# Patient Record
Sex: Male | Born: 1965 | Race: Black or African American | Hispanic: No | Smoking: Never smoker
Health system: Southern US, Community
[De-identification: ages and names within clinical notes are randomized; demographics above are authoritative.]

## PROBLEM LIST (undated history)

## (undated) HISTORY — PX: OTHER SURGICAL HISTORY: SHX169

## (undated) HISTORY — PX: NECK SURGERY: SHX720

---

## 1998-08-17 ENCOUNTER — Emergency Department (HOSPITAL_COMMUNITY): Admission: EM | Admit: 1998-08-17 | Discharge: 1998-08-17 | Payer: Self-pay | Admitting: Emergency Medicine

## 2001-07-01 ENCOUNTER — Emergency Department (HOSPITAL_COMMUNITY): Admission: EM | Admit: 2001-07-01 | Discharge: 2001-07-01 | Payer: Self-pay | Admitting: *Deleted

## 2002-02-06 ENCOUNTER — Emergency Department (HOSPITAL_COMMUNITY): Admission: EM | Admit: 2002-02-06 | Discharge: 2002-02-06 | Payer: Self-pay | Admitting: Emergency Medicine

## 2002-02-06 ENCOUNTER — Encounter: Payer: Self-pay | Admitting: Emergency Medicine

## 2002-03-02 ENCOUNTER — Encounter: Payer: Self-pay | Admitting: *Deleted

## 2002-03-02 ENCOUNTER — Emergency Department (HOSPITAL_COMMUNITY): Admission: EM | Admit: 2002-03-02 | Discharge: 2002-03-02 | Payer: Self-pay | Admitting: *Deleted

## 2002-03-11 ENCOUNTER — Ambulatory Visit (HOSPITAL_COMMUNITY): Admission: RE | Admit: 2002-03-11 | Discharge: 2002-03-11 | Payer: Self-pay | Admitting: Family Medicine

## 2002-03-11 ENCOUNTER — Encounter: Payer: Self-pay | Admitting: Family Medicine

## 2002-04-15 ENCOUNTER — Observation Stay (HOSPITAL_COMMUNITY): Admission: RE | Admit: 2002-04-15 | Discharge: 2002-04-16 | Payer: Self-pay | Admitting: Neurosurgery

## 2002-07-25 ENCOUNTER — Encounter (HOSPITAL_COMMUNITY): Admission: RE | Admit: 2002-07-25 | Discharge: 2002-08-24 | Payer: Self-pay | Admitting: General Surgery

## 2002-12-09 ENCOUNTER — Ambulatory Visit (HOSPITAL_COMMUNITY): Admission: RE | Admit: 2002-12-09 | Discharge: 2002-12-09 | Payer: Self-pay | Admitting: Neurosurgery

## 2003-10-09 ENCOUNTER — Ambulatory Visit (HOSPITAL_COMMUNITY): Admission: RE | Admit: 2003-10-09 | Discharge: 2003-10-09 | Payer: Self-pay | Admitting: Family Medicine

## 2003-10-28 ENCOUNTER — Ambulatory Visit (HOSPITAL_COMMUNITY): Admission: RE | Admit: 2003-10-28 | Discharge: 2003-10-28 | Payer: Self-pay | Admitting: General Surgery

## 2004-02-10 ENCOUNTER — Emergency Department (HOSPITAL_COMMUNITY): Admission: EM | Admit: 2004-02-10 | Discharge: 2004-02-10 | Payer: Self-pay | Admitting: Emergency Medicine

## 2004-04-15 ENCOUNTER — Inpatient Hospital Stay (HOSPITAL_COMMUNITY): Admission: RE | Admit: 2004-04-15 | Discharge: 2004-04-17 | Payer: Self-pay | Admitting: Neurosurgery

## 2004-07-05 ENCOUNTER — Ambulatory Visit (HOSPITAL_COMMUNITY): Admission: RE | Admit: 2004-07-05 | Discharge: 2004-07-05 | Payer: Self-pay | Admitting: Neurosurgery

## 2004-07-06 ENCOUNTER — Encounter (HOSPITAL_COMMUNITY): Admission: RE | Admit: 2004-07-06 | Discharge: 2004-08-05 | Payer: Self-pay | Admitting: Neurosurgery

## 2004-08-10 ENCOUNTER — Ambulatory Visit (HOSPITAL_COMMUNITY): Admission: RE | Admit: 2004-08-10 | Discharge: 2004-08-10 | Payer: Self-pay | Admitting: General Surgery

## 2006-07-07 ENCOUNTER — Emergency Department (HOSPITAL_COMMUNITY): Admission: EM | Admit: 2006-07-07 | Discharge: 2006-07-07 | Payer: Self-pay | Admitting: Emergency Medicine

## 2006-08-03 ENCOUNTER — Ambulatory Visit (HOSPITAL_COMMUNITY): Admission: RE | Admit: 2006-08-03 | Discharge: 2006-08-03 | Payer: Self-pay | Admitting: General Surgery

## 2006-12-01 ENCOUNTER — Emergency Department (HOSPITAL_COMMUNITY): Admission: EM | Admit: 2006-12-01 | Discharge: 2006-12-01 | Payer: Self-pay | Admitting: Emergency Medicine

## 2006-12-26 ENCOUNTER — Emergency Department (HOSPITAL_COMMUNITY): Admission: EM | Admit: 2006-12-26 | Discharge: 2006-12-26 | Payer: Self-pay | Admitting: Emergency Medicine

## 2007-02-26 ENCOUNTER — Encounter
Admission: RE | Admit: 2007-02-26 | Discharge: 2007-05-27 | Payer: Self-pay | Admitting: Physical Medicine and Rehabilitation

## 2007-02-26 ENCOUNTER — Ambulatory Visit: Payer: Self-pay | Admitting: Physical Medicine and Rehabilitation

## 2007-04-18 ENCOUNTER — Ambulatory Visit: Payer: Self-pay | Admitting: Physical Medicine and Rehabilitation

## 2007-05-21 ENCOUNTER — Ambulatory Visit: Payer: Self-pay | Admitting: Physical Medicine and Rehabilitation

## 2007-06-20 ENCOUNTER — Encounter
Admission: RE | Admit: 2007-06-20 | Discharge: 2007-09-18 | Payer: Self-pay | Admitting: Physical Medicine and Rehabilitation

## 2007-06-20 ENCOUNTER — Ambulatory Visit: Payer: Self-pay | Admitting: Physical Medicine and Rehabilitation

## 2007-08-14 ENCOUNTER — Ambulatory Visit: Payer: Self-pay | Admitting: Physical Medicine and Rehabilitation

## 2007-12-04 ENCOUNTER — Encounter
Admission: RE | Admit: 2007-12-04 | Discharge: 2007-12-05 | Payer: Self-pay | Admitting: Physical Medicine and Rehabilitation

## 2007-12-04 ENCOUNTER — Ambulatory Visit: Payer: Self-pay | Admitting: Physical Medicine and Rehabilitation

## 2007-12-31 ENCOUNTER — Emergency Department (HOSPITAL_COMMUNITY): Admission: EM | Admit: 2007-12-31 | Discharge: 2007-12-31 | Payer: Self-pay | Admitting: Emergency Medicine

## 2008-03-24 ENCOUNTER — Encounter
Admission: RE | Admit: 2008-03-24 | Discharge: 2008-06-22 | Payer: Self-pay | Admitting: Physical Medicine and Rehabilitation

## 2008-03-26 ENCOUNTER — Ambulatory Visit: Payer: Self-pay | Admitting: Physical Medicine and Rehabilitation

## 2008-03-27 ENCOUNTER — Ambulatory Visit (HOSPITAL_COMMUNITY)
Admission: RE | Admit: 2008-03-27 | Discharge: 2008-03-27 | Payer: Self-pay | Admitting: Physical Medicine and Rehabilitation

## 2008-04-01 ENCOUNTER — Encounter (HOSPITAL_COMMUNITY)
Admission: RE | Admit: 2008-04-01 | Discharge: 2008-05-01 | Payer: Self-pay | Admitting: Physical Medicine and Rehabilitation

## 2008-05-06 ENCOUNTER — Encounter (HOSPITAL_COMMUNITY)
Admission: RE | Admit: 2008-05-06 | Discharge: 2008-06-05 | Payer: Self-pay | Admitting: Physical Medicine and Rehabilitation

## 2008-05-14 ENCOUNTER — Ambulatory Visit: Payer: Self-pay | Admitting: Physical Medicine and Rehabilitation

## 2008-06-05 ENCOUNTER — Encounter (HOSPITAL_COMMUNITY)
Admission: RE | Admit: 2008-06-05 | Discharge: 2008-07-05 | Payer: Self-pay | Admitting: Physical Medicine and Rehabilitation

## 2008-06-12 ENCOUNTER — Encounter
Admission: RE | Admit: 2008-06-12 | Discharge: 2008-08-08 | Payer: Self-pay | Admitting: Physical Medicine and Rehabilitation

## 2008-06-13 ENCOUNTER — Ambulatory Visit: Payer: Self-pay | Admitting: Physical Medicine and Rehabilitation

## 2008-07-11 ENCOUNTER — Ambulatory Visit: Payer: Self-pay | Admitting: Physical Medicine and Rehabilitation

## 2008-08-08 ENCOUNTER — Ambulatory Visit: Payer: Self-pay | Admitting: Physical Medicine and Rehabilitation

## 2008-09-01 ENCOUNTER — Encounter
Admission: RE | Admit: 2008-09-01 | Discharge: 2008-09-05 | Payer: Self-pay | Admitting: Physical Medicine and Rehabilitation

## 2008-09-05 ENCOUNTER — Ambulatory Visit: Payer: Self-pay | Admitting: Physical Medicine and Rehabilitation

## 2008-12-04 ENCOUNTER — Encounter
Admission: RE | Admit: 2008-12-04 | Discharge: 2008-12-04 | Payer: Self-pay | Admitting: Physical Medicine and Rehabilitation

## 2009-07-03 ENCOUNTER — Emergency Department (HOSPITAL_COMMUNITY): Admission: EM | Admit: 2009-07-03 | Discharge: 2009-07-03 | Payer: Self-pay | Admitting: Emergency Medicine

## 2009-08-20 ENCOUNTER — Encounter
Admission: RE | Admit: 2009-08-20 | Discharge: 2009-11-18 | Payer: Self-pay | Admitting: Physical Medicine and Rehabilitation

## 2009-08-24 ENCOUNTER — Ambulatory Visit: Payer: Self-pay | Admitting: Physical Medicine and Rehabilitation

## 2009-08-26 ENCOUNTER — Ambulatory Visit (HOSPITAL_COMMUNITY)
Admission: RE | Admit: 2009-08-26 | Discharge: 2009-08-26 | Payer: Self-pay | Admitting: Physical Medicine and Rehabilitation

## 2009-08-26 ENCOUNTER — Emergency Department (HOSPITAL_COMMUNITY): Admission: EM | Admit: 2009-08-26 | Discharge: 2009-08-26 | Payer: Self-pay | Admitting: Emergency Medicine

## 2009-09-28 ENCOUNTER — Ambulatory Visit: Payer: Self-pay | Admitting: Physical Medicine and Rehabilitation

## 2009-09-30 ENCOUNTER — Encounter (HOSPITAL_COMMUNITY)
Admission: RE | Admit: 2009-09-30 | Discharge: 2009-10-30 | Payer: Self-pay | Admitting: Physical Medicine and Rehabilitation

## 2009-11-09 ENCOUNTER — Ambulatory Visit: Payer: Self-pay | Admitting: Physical Medicine and Rehabilitation

## 2009-11-17 ENCOUNTER — Ambulatory Visit (HOSPITAL_COMMUNITY)
Admission: RE | Admit: 2009-11-17 | Discharge: 2009-11-17 | Payer: Self-pay | Admitting: Physical Medicine and Rehabilitation

## 2009-11-26 ENCOUNTER — Emergency Department (HOSPITAL_COMMUNITY): Admission: EM | Admit: 2009-11-26 | Discharge: 2009-11-26 | Payer: Self-pay | Admitting: Emergency Medicine

## 2009-12-10 ENCOUNTER — Encounter
Admission: RE | Admit: 2009-12-10 | Discharge: 2010-03-10 | Payer: Self-pay | Admitting: Physical Medicine and Rehabilitation

## 2009-12-11 ENCOUNTER — Ambulatory Visit: Payer: Self-pay | Admitting: Physical Medicine and Rehabilitation

## 2009-12-11 ENCOUNTER — Emergency Department (HOSPITAL_COMMUNITY): Admission: EM | Admit: 2009-12-11 | Discharge: 2009-12-11 | Payer: Self-pay | Admitting: Emergency Medicine

## 2010-05-03 ENCOUNTER — Ambulatory Visit (HOSPITAL_COMMUNITY): Admission: RE | Admit: 2010-05-03 | Discharge: 2010-05-03 | Payer: Self-pay | Admitting: Neurosurgery

## 2010-05-06 ENCOUNTER — Emergency Department (HOSPITAL_COMMUNITY): Admission: EM | Admit: 2010-05-06 | Discharge: 2010-05-06 | Payer: Self-pay | Admitting: Emergency Medicine

## 2010-12-18 ENCOUNTER — Encounter: Payer: Self-pay | Admitting: Internal Medicine

## 2010-12-19 ENCOUNTER — Encounter: Payer: Self-pay | Admitting: Physical Medicine and Rehabilitation

## 2011-02-13 LAB — CBC
HCT: 42.9 % (ref 39.0–52.0)
Hemoglobin: 14.6 g/dL (ref 13.0–17.0)
Platelets: 266 10*3/uL (ref 150–400)
WBC: 5 10*3/uL (ref 4.0–10.5)

## 2011-02-13 LAB — BASIC METABOLIC PANEL
Calcium: 9.6 mg/dL (ref 8.4–10.5)
Creatinine, Ser: 1.11 mg/dL (ref 0.4–1.5)
GFR calc Af Amer: >60 mL/min (ref >60)
GFR calc non Af Amer: >60 mL/min (ref >60)

## 2011-02-13 LAB — DIFFERENTIAL
Basophils Relative: 1 % (ref 0–1)
Eosinophils Absolute: 0.1 10*3/uL (ref 0.0–0.7)
Lymphocytes Relative: 40 % (ref 12–46)
Lymphs Abs: 2 10*3/uL (ref 0.7–4.0)
Monocytes Relative: 10 % (ref 3–12)
Neutro Abs: 2.4 10*3/uL (ref 1.7–7.7)

## 2011-02-13 LAB — ETHANOL: Alcohol, Ethyl (B): <5 mg/dL (ref 0–10)

## 2011-02-13 LAB — RAPID URINE DRUG SCREEN, HOSP PERFORMED
Amphetamines: NOT DETECTED
Benzodiazepines: NOT DETECTED

## 2011-02-14 LAB — DIFFERENTIAL
Eosinophils Absolute: 0.1 10*3/uL (ref 0.0–0.7)
Lymphocytes Relative: 43 % (ref 12–46)
Lymphs Abs: 2.2 10*3/uL (ref 0.7–4.0)
Neutrophils Relative %: 47 % (ref 43–77)

## 2011-02-14 LAB — BASIC METABOLIC PANEL
CO2: 32 mEq/L (ref 19–32)
Calcium: 9.4 mg/dL (ref 8.4–10.5)
Chloride: 102 mEq/L (ref 96–112)
Potassium: 3.9 mEq/L (ref 3.5–5.1)
Sodium: 138 mEq/L (ref 135–145)

## 2011-02-14 LAB — CBC
HCT: 40.2 % (ref 39.0–52.0)
Hemoglobin: 13.6 g/dL (ref 13.0–17.0)
MCHC: 33.8 g/dL (ref 30.0–36.0)
MCV: 88 fL (ref 78.0–100.0)
Platelets: 244 10*3/uL (ref 150–400)
RDW: 15 % (ref 11.5–15.5)

## 2011-02-14 LAB — URINALYSIS, ROUTINE W REFLEX MICROSCOPIC
Bilirubin Urine: NEGATIVE
Glucose, UA: NEGATIVE mg/dL
Ketones, ur: NEGATIVE mg/dL
Protein, ur: NEGATIVE mg/dL

## 2011-02-14 LAB — RAPID URINE DRUG SCREEN, HOSP PERFORMED
Amphetamines: NOT DETECTED
Opiates: NOT DETECTED
Tetrahydrocannabinol: POSITIVE — AB

## 2011-04-12 NOTE — Assessment & Plan Note (Signed)
Craig Orr is a 45 year old, African-American gentleman, who was  seen last by me in our [facility] on May 24, 2007.   He is back in today for refill of his medication.   He has multiple chronic pain complaints predominantly revolving around  his cervical spine.  He has had multiple fusions at C3-4, 4-5, 5-6, and  add-on C6-7 by Dr. Lovell Sheehan.   His average pain is about an 8 on a scale of 10.  The pain is described  as constant, aching, burning, stabbing, worse with bending, and certain  activities aggravate him more, improves with medications, and he is  getting fair relief with the current meds that he is on from this  clinic.   MEDICATIONS PROVIDED BY THIS CLINIC:  1. Neurontin, which he takes 300 mg three times a day.  2. Lidoderm 5% patch, 12 hours on and 12 hours off.   FUNCTIONAL STATUS:  The patient is able to walk about 30 minutes at a  time, he is able to drive, he is independent with his self-care with the  exception of bathing, he needs some help washing his back.  Denies  problems controlling bowel or bladder.  Denies depression, anxiety, or  suicidal ideation.  Does have some intermittent tingling and spasms.   Health and History Form Review of Systems is attached to this chart.   No other change in his past medical, social, or family history since  last visit.   PHYSICAL EXAMINATION:  VITAL SIGNS:  Blood pressure is 120/67, pulse 48,  respirations 18, 99% saturated on room air.  GENERAL:  He is a thin, well-developed gentleman, who does not appear in  any distress.  He does appear his stated age.   He is oriented x3.  Speech is clear.  Affect is bright, alert,  cooperative, and pleasant.  He follows commands without difficulty.   Transitioning from sit to stand easily.  Gait in the room is  nonantalgic.  Tandem gait and Romberg's test are performed adequately.  He has significantly decreased range of motion in all planes in his  cervical spine.  Shoulder range is also somewhat diminished especially  with abduction passed 90.  Forward flexion is approximately 110  bilaterally.   Motor strength is good in both upper extremities without focal weakness.  He is a well-muscled gentleman.  He does complain of some discomfort  with testing intrinsic muscles and finger flexors, however.   Reflexes are symmetric and intact, no abnormal tone is noted, no clonus  is noted, motor strength is good throughout both upper and lower  extremities.   IMPRESSION:  1. Cervicalgia, status post C3-4, C4-5, C5-6, add-on C6-7 fusion by      Dr. Lovell Sheehan.  2. Stable cervical myelomalacia.  3. Cervicalgia.  4. Sensory deficits in the left upper extremity.  5. History of upper and lower extremity spasms currently not a problem      today.  At the last visit, he had had some problems with flank pain      and apparently this has not been a problem for him at this visit.   We will refill the following medications for him:  Neurontin 300 mg one  p.o., will increase this to 5 times per day with one refill.  We will  start him on some ibuprofen 600 mg in the morning with one refill, 30  tablets, and Prilosec 20 mg one p.o. daily, #30, one refill.  We will  see him back in two months.  He has not had any problems with over-  sedation or balance problems from any of the medication.           ______________________________  Brantley Stage, M.D.     DMK/MedQ  D:  06/21/2007 11:40:38  T:  06/21/2007 19:13:53  Job #:  [id number]

## 2011-04-12 NOTE — Assessment & Plan Note (Signed)
Craig Orr is a 45 year old African-American gentleman who was  last seen by me on August 16, 2007.  He has a history of a cervical  fusion at multiple levels, C3, basically to C7 by Dr. Lovell Orr.  He is  here for refill of his medications.  He continues to have some pain  especially around the parascapular region in the posterior cervical  region, down the left arm, intermittently.  Low back pain has been a  milder problem for him as well.  His average pain is between a 6 and a 7  on a scale of 10.  It is worse with activities, walking and bending,  improves with medication.  He gets fair relief with the current  medications that he is on.  The pain affects his general activity  moderately, and affects his enjoyment of life a little bit.   Medications provided by our clinic include Neurontin 400 mg one p.o.  q.i.d. #120, and ibuprofen 600 mg 1 p.o. q.a.m. p.r.n. neck pain #30.   He is independent with his self-care.  He is able to walk about 30  minutes at a time.  He is able to drive.  He is not employed.  He  reports no problems controlling bowel or bladder.  He does admit to the  occasional depression, but denies suicidal ideation.   He has been going through some dental work recently, otherwise he has  been healthy since he was last seen.  He lives with his girlfriend and a  grandchild.  He does not smoke, but occasionally drinks beer.   PHYSICAL EXAMINATION:  VITAL SIGNS:  On exam today his blood pressure  118/73, pulse 50, respirations 18, 100% saturation on room air.  GENERAL:  He is a well-developed, thin, adult male who does not appear  in any distress.  His weight, today, is 152.4, which is up.  Approximately 5 pounds from the last visit.  NEUROLOGIC:  He is oriented x3.  Speech is clear.  Affect is bright.  He  is alert, cooperative, and pleasant.  He follows commands without  difficulty.  He transitions from sitting-to-standing easily, gait in the  room displays  good balance and coordination.  No antalgic gait is noted.  He has significant limitations of motion in his neck in all planes.  Shoulder range of motion is fairly well preserved.  Lumbar range is  mildly limited.  He does not display any pain behaviors as he goes  through these ranges of motion.  Reflexes are intact and symmetric in  the upper extremities.  No abnormal tone is noted in the lower  extremities or upper extremities.  No clonus is noted.  He has good  strength in upper-and-lower extremities, no focal deficits are  appreciated.  He continues to have some patchy numbness in his upper  extremities.   IMPRESSION:  1. Cervicalgia status post C3-4, C4-5, C5-6, and C6-7 fusion by Dr.      Lovell Orr.  2. Stable cervical myelomalacia.  3. Cervicalgia.  4. Sensory deficit left upper extremity.  Status post an ulnar      transposition, remotely.  5. No promise today regarding upper or lower extremity spasms      verbalized by patient.   PLAN:  We will refill the following medications for him:  Ibuprofen 600  mg one p.o. q.a.m. #30 one refill, Neurontin 400 mg one p.o. q.i.d. #120  one refill.   He is doing well on these medications.  He  is tolerating them well.  The  slight increase in his Neurontin seems to have helped his pain  significantly.  He would like to stay at this level, now, he does not  feel the need to increase it at all.  I encourage contact with the  family doctor or internist.  He apparently had had a weight loss;  however, from last  clinic to this clinic he has gained some weight, approximately 5 pounds.  We will see him back in two months. Craig Orr is being managed  nonnarcotic weight in light of a positive urine drug screen for THC.           ______________________________  Brantley Stage, M.D.     DMK/MedQ  D:  09/12/2007 11:31:29  T:  09/13/2007 09:11:04  Job #:  [id number]

## 2011-04-12 NOTE — Assessment & Plan Note (Signed)
Craig Orr is a 45 year old gentleman who is followed in our [facility] for pain complaints related to his neck and  upper extremities as well as he has some chronic low back pain as well.   He is known to have cervical fusion C3-C7 by Dr. Lovell Orr.  He also has  stable cervical myelomalacia.  He was last seen by me on March 26, 2008.   He has been participating in a physical therapy program to work on upper  extremity range of motion across his pectoralis region as well as lower  extremity flexibility.  He has very tight hamstrings and was developing  some pain in the lower extremities, which is more soft tissue related.   His average pain is about 7-8 on a scale of 10, localized to the  cervical region and low back and bilateral lower extremities.  The pain  in the lower extremities is now along the lateral hip bilaterally to  approximately the knees for the most part.   The pain is described as sharp, dull, stabbing, tingling, and aching in  nature, worse with activity, and worse towards the end of the day.  The  pain improves with heat and ice and medication.  He reports fair-to-good  relief with medications that are prescribed by our clinic.   MEDICATONS:  Prescribed by Rehabilitative Clinic include:  1. Lidoderm on a p.r.n. basis.  2. Prilosec 20 mg daily.  3. Ibuprofen 600 mg q.a.m.  4. Neurontin 400 mg q.i.d.   His functional status is as follows.  He is able to walk about 20  minutes at a time.  He has difficulty with stairs.  He is able to drive.  He is independent with his self-care, but does need some assistance with  high level activities.   REVIEW OF SYSTEMS:  Positive for numbness, spasms, occasional dizziness,  and confusion.  Otherwise, review of systems is noncontributory.   Past medical, social, and family history are unchanged since last visit.   PHYSICAL EXAMINATION:  Blood pressure today is 138/81, pulse is 56,  respirations 18,  and 100% saturated on room air. He is a thin, adult  gentleman, who does not appear in any distress.  He is oriented x3.  Speech is clear.  His affect is bright.  He is alert, cooperative, and  pleasant.  He follows commands easily.   Transitioning from sitting to standing is done without difficulty.  He  typically appears rather stiff when he first gets up, which evens out a  little bit when he is walking.  He has some slight flexion at his hip  when he starts to walk with a very flat lumbar spine, which is unchanged  from the last visit.   His balance is good.  Tandem gait and Romberg test are all performed  adequately.   [name] has limitations in cervical range of motion in all  planes including forward flexion, extension, and lateral rotation.  His  rotation both right and left is not more than 20 degrees.   He also has some limitations in shoulder range of motion to about 100  degrees with shoulder abduction.  He complains of tightness in the  pectoralis region with these movements.  Extending his legs straight  while he is sitting reveals significantly tight hamstrings bilaterally.  He has difficulty fully extending his legs because of these hamstrings.   His reflexes are otherwise intact.  No abnormal tone is noted  today.  No  clonus is noted today.   On examining his lateral hip today, he has significant tenderness over  the trochanters bilaterally and also tenderness goes down to iliotibial  bands to proximally the knees.   IMPRESSION:  1. Bilateral trochanteric bursitis and iliotibial band syndrome.  2. Lumbago.  3. Cervicalgia, status post C3-C7 fusion, Dr. Lovell Orr.  4. Stable cervical myelomalacia.  5. History of sensory deficit in the upper extremities, status post      ulnar transposition on the left remotely.   PLAN:  He does not need refills on his Flexeril, Lidoderm, Prilosec, or  Neurontin at this time.  We will trial him on Mobic for the next  couple  of months 7.5 one p.o. daily, #30.  He has been stable on the above  medications.  We discussed the risks and benefits of Mobic with him  including gastrointestinal as well as cardiovascular side effects, which  would include heart attack or stroke.  He understands the risks and  benefits of this medication.  He would like to trial it despite some of  the risks to improve his overall function.  We will also continue him in  physical therapy with an emphasis on lower extremity stretching to  address his tight iliotibial band.  We would like them to use some  ultrasound and give him some further stretches for the lower  extremities.  He seems to be quite interested in physical therapy at  this time.  He feels he did make some gains while he was in the program  last month.  I will see him back in 2 months.           ______________________________  Craig Orr, M.D.     DMK/MedQ  D:  05/14/2008 12:06:40  T:  05/15/2008 04:34:26  Job #:  [id number]

## 2011-04-12 NOTE — Assessment & Plan Note (Signed)
Craig Orr is a 45 year old African-American gentleman who was  last seen by me on December 05, 2007.  He is being followed in our pain  and rehabilitative clinic for chronic cervicalgia and intermittent low-  back stiffness.   He is known to have multiple cervical fusions at C3-4, C4-5, C5-6, and  C6-7 by Dr. Lovell Sheehan; and also has stable cervical myelomalacia.  He was  last seen on December 05, 2007.  In the interim he states that he has had  a episode of increased neck spasm for he went to the emergency room.  He  denies any history of trauma.  He denies any injuries. No motor vehicle  accidents.  He simply woke up, one morning, with some increased  stiffness in his neck.  He states that his average pain in the neck and  the low back is between an 8 and a 9 on a scale of 10.  General activity  is significantly affected.  Sleep tends to be poor.  He is getting fair  relief with current meds provided by this clinic.   Pain is typically worse with activities which involve moving his neck  more.  He improves with rest, heat, and medications. His ambulation is  limited to about 20 minutes.  He gets pain in the low back, and slightly  down the left leg with prolonged walking.  He states that he is unable  to climb stairs.  He is able to drive.   FUNCTIONAL STATUS:  He is independent with feeding dressing, bathing,  and toileting.  He needs assistance with meal prep, household duties and  shopping.   He denies problems controlling bowel or bladder.  He admits to some  trouble walking, and neck and back spasms.  He denies suicidal ideation.  He has some mild constipation which for the most part is under fairly  good control.   No changes in past medical, social, or family history.  He continues to  live with his girlfriend.  They have 3 children and a 77-year-old  granddaughter who lives in the house as well.  He denies smoking,  occasional drinks a beer.   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   MEDICATIONS PROVIDED BY OUR CLINIC INCLUDE:  1. Lidoderm 5% on a p.r.n. basis.  2. Prilosec 20 mg daily.  3. Ibuprofen 600 mg q.a.m.  4. Neurontin 400 mg 1 p.o. q.i.d.   EXAM TODAY:  Blood pressure 131/86, pulse 58, respirations 18, 100%  saturated on room air. He is a thin adult gentleman who does not appear  in any distress.  He is oriented x3.  Speech is clear.  His affect is  bright.  He is alert, cooperative, and pleasant.  He follows commands  without difficulty.   Transitioning from sitting-to-standing is done without difficulty.  He  does appear a bit stiff though, however, when he initially stands up.  He has some slight flexion at his hips when he starts to walk with a  fairly flat lumbar spine.   He has good balance during ambulation in the room.  He has some  difficulty with tandem gait.  Romberg's test, however, is performed  adequately.   Limitations are noted in cervical range of motion in all plants.  Lumbar  motion is also limited in all planes. Reflexes are slightly brisk in the  upper and lower extremities; however, there is no clonus that  is  appreciated.  No abnormal tone is noted in the upper and lower  extremities.   Tenderness is noted in the cervical paraspinal musculature as well as  the lumbar paraspinal musculature.   IMPRESSION:  1. Lumbago.  2. Cervicalgia status post C3-4, C4-5, C5-6, and C6-7 fusion by Dr.      Lovell Sheehan.  3. Stable cervical myelomalacia.  4. He is also known to have some sensory deficits in the upper      extremities status post ulnar transposition remotely.   PLAN:  1. We will give him a prescription for a lumbar support today.  2. We will refill the following medications:      a.     Flexeril 10 mg 1 p.o. q.p.m. p.r.n. neck or back spasm #30       with a refill.      b.     Lidoderm 5% at 12 hours on 12 hours off 1-3 patches #60.       c.     Prilosec 20 mg 1 p.o. daily #30.      d.     Neurontin 400 mg 1 p.o. q.i.d. #120 per month.  3. Will obtain cervical flexion-extension films as he has had some      increased problems with his neck recently.  He was seen in the      emergency room and has multiple fusions.  We would like to make      sure that there is no new instability here.  We will also get him      set up for physical therapy for a good stretching program to help      him loose some of the stiffness and difficulty with initialing      getting up from sitting to ambulating.  He was given a note today      stating that he has been seen in the clinic.           ______________________________  Brantley Stage, M.D.     DMK/MedQ  D:  03/26/2008 09:42:35  T:  03/26/2008 10:22:34  Job #:  [id number]

## 2011-04-12 NOTE — Assessment & Plan Note (Signed)
Craig Orr is a 45 year old African-American gentleman who was last  seen by me in this clinic on June 21, 2007.   He is back in today for a refill of his medications.   He continues to have complaints of cervicalgia as well as left upper  extremity pain and lumbago and he has intermittent lower extremity pain,  sometimes more bilaterally.  Last visit he had pain in the right lower  extremity, today he reports pain in the left lower extremity.  He states  he has had some weight loss as well and decreased appetite.  Denies any  problems with abdominal pain or diarrhea or constipation, nausea or  vomiting.   He reports his pain is worse with bending.  He gets a little relief with  the current meds that he is on.  Pain is intermittent, sharp, burning,  stabbing, sometimes more constant in nature.  Localized mainly in the  posterior cervical region, radiating to the left upper extremity to  about the elbow and then in the lower extremity, down the posterior  aspect of the left lower extremity to the mid calf region.   He is able to walk about 30 minutes at a time.  He is independent with  his ADLs.  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   Denies problems controlling bowel or bladder.  Denies suicidal ideation.  Denies depression or anxiety.   Reports weight loss, occasional night sweats and poor appetite.  Denies  nausea, vomiting, diarrhea, constipation, urinary pain, abdominal pain,  denies cough, denies wheezing.  Reports occasional shortness of breath,  denies chest pain.   PAST MEDICAL HISTORY:  Otherwise noncontributory.  He states he does not  have a family MD.  I have strongly encouraged him to obtain an internist  or family doctor who can manage his general medical problems.   SOCIAL AND FAMILY HISTORY:  Are otherwise unchanged.   MEDICATIONS PRESCRIBED BY THIS CLINIC:  1. Neurontin 300 mg up to 3 times a day.  2. Lidoderm on a p.r.n. basis.   He also uses ibuprofen  600 mg in the morning and Prilosec 20 mg daily.   On exam today, his blood pressure is 133/71, pulse 65, respirations 18,  100% saturated on room air.  He is a thin adult gentleman who does not  appear in any distress.  His weight is 147.8.  He states this is down  about 15 pounds then what it used to be 6 months to a year ago.  He is oriented x3.  Speech is clear.  His affect is bright.  He is  alert, cooperative and pleasant.  Follows commands without difficulty.  Transitioning from sit to stand.  He displays a slightly antalgic gait.  Limitations are significant in his cervical spine with respect to  rotation and flexion/extension.  He has some limitations in shoulder  motion.  He is able to get above 90 degrees with both shoulders, maximum  about 110 bilaterally.  Lumbar motion is moderately limited as well in forward flexion and  extension.  Reflexes are 2+ in the upper extremities at the biceps, triceps,  brachial radialis, 2+ at patellar tendons and Achilles tendons.  No  clonus is noted, no abnormal tone is noted today.  Motor strength in the  upper extremities is in the 5/5 range.  He displays some give-way  weakness with hip flexion, otherwise he is 5/5 in the lower extremities.  Patchy numbness in all extremities are  noted with light touch exam  today.   IMPRESSION:  1. Cervicalgia status post C3-4, C4-5, C5-6 and add on C6-7 fusion Dr.      Lovell Sheehan.  2. Stable cervical myelomalacia.  3. Cervicalgia.  4. Sensory deficits left upper extremity persist.  5. History of upper and lower extremity spasms which are not      appreciated today, no clonus is noted, no abnormal tone is noted.      He states he has been on Valium in the past; however, will hold off      on any spasticity medication at this time.  If one is considered,      would consider baclofen for him.   In light of his extremity pain, will increase his Neurontin slightly  from 300 mg three times a day, will  increase it to 400 mg three times to  four times a day, give him 120 for the month, no refills.  Will continue  Prilosec 20 mg one p.o. daily, #30, with a refill and will refill his  Lidoderm patch for him, up to 2 patches a day, with 3 refills.   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  He is being managed narcotically in this clinic.  May  consider imaging studies if he has persistent left leg pain which is  unremitting over the next couple of weeks.  Will see him back in a  month.  He is to continue to monitor his neurologic exam for weakness or  increased tone.  I have strongly recommended he find an internist or  family doctor who can manage his other medical problems including his 15  pound weight loss which he states has progressed over the last 6 months  to a year.           ______________________________  Brantley Stage, M.D.     DMK/MedQ  D:  08/16/2007 10:52:30  T:  08/16/2007 12:45:19  Job #:  [id number]

## 2011-04-12 NOTE — Assessment & Plan Note (Signed)
Mr. Keidan Aumiller is a 45 year old African American gentleman who  is followed in our Pain and Rehabilitative clinic for some complaints  related to neck pain, upper extremity pain, low back pain, and lower  extremity pain.   He has had cervical fusion, C3 through C7, Dr. Lovell Sheehan.  He also has  some chronic low back pain.  He is noted to have stable cervical  myelomalacia and has been in physical therapy for tight hamstrings and  iliotibial band syndrome.   He continues to perform a lower extremity flexibility program to  maintain hamstring length and flexibility around his hips.  He reports  that is going well for him.  He has been trialed on Mobic.  He states  that the ibuprofen 800 in the morning was much better for him with  respect to his function.   He indicates his average pain is about a 7 on a scale of 10, interfering  significantly with activity levels.  Pain is described as being  localized in the posterior cervical region at the right shoulder down  the left arm, bilateral lower extremities, and low back.  Pain is noted  to be constant, sharp, burning, stabbing, tingling, and aching in  nature.  Sleeps tends to be poor.   FUNCTIONAL STATUS:  Mr. Atchley is able to walk about 20 minutes at  a time.  He does this daily.  He is able to drive.  He is independent  with his self-care.   REVIEW OF SYSTEMS:  Positive for intermittent numbness, tingling,  trouble walking, spasm, dizziness, confusion, depression.  Denies  suicidal ideation.  Denies problems controlling bowel or bladder.  Has  had some recent problems with abdominal pain and poor appetite.  I asked  him to follow up with primary care for this.   MEDICATIONS:  Which are provided through this clinic include;  1. Lidoderm on a p.r.n. basis.  2. Prilosec 20 mg daily.  3. Neurontin 400 mg q.i.d.  4. Mobic 7.5 one p.o. daily.  5. Flexeril 10 mg one p.o. every evening p.r.n.   PHYSICAL EXAMINATION:   VITAL SIGNS:  Blood pressure is 123/96, pulse 56,  respiration 18, and 100% saturated on room air.  GENERAL:  Mr. Meikle is a thin, adult African American gentleman,  who does not appear in any distress.  He is oriented x3.  His speech is  clear.  Affect is bright.  He is alert, cooperative, and pleasant.  Follows commands without any difficulty.   Cranial nerves are grossly intact.  Coordination is intact.  Reflexes  are slightly brisk throughout.  No clonus is appreciated today.  Sensation is patchy throughout upper and lower extremities, which is not  new.   His motor strength is 5/5 in the lower extremities at hip flexors, knee  extensors, dorsiflexors, plantar flexors, EHL, and also 5/5 in the upper  extremities.   Musculoskeletal exam reveals diminished range of motion in all planes  with respect to cervical spine.  He has about 90 degrees of abduction in  the shoulders.  He has functional range of motion at the hip  bilaterally.   Hamstrings overall have improved in length.   IMPRESSION:  1. Overall improvement in trochanteric bursitis and iliotibial band      syndrome.  2. Overall improvement in hamstring length and improved flexibility.  3. Lumbago.  4. Cervicalgia.  5. Stable cervical myelomalacia.  6. Status post fusion, C3 through C7, Dr. Lovell Sheehan.  7. History of  sensory deficits in the upper extremities status post      ulnar transposition on the left remotely.   PLAN:  We will discontinue Mobic.  We will place him back on ibuprofen  800 mg in the morning on a p.r.n. basis, #30 with a refill.  I will see  him back in 2 months and nursing visit next month for refill of  medication.  Again, I encouraged him to follow up with primary care for  any other medical complaints he may have.  He states he will comply.           ______________________________  Brantley Stage, M.D.     DMK/MedQ  D:  08/08/2008 09:51:17  T:  08/09/2008 00:21:08  Job #:   [id number]

## 2011-04-12 NOTE — Assessment & Plan Note (Signed)
HISTORY OF PRESENT ILLNESS:  [name] is a 45 year old African  American gentleman who is followed in our Pain and Rehabilitative Clinic  for complaints related to neck pain, upper extremity pain, as well as  chronic low back pain and lateral hip pain.   He is back in today for a brief recheck and refill of his Flexeril.   He is known to have a cervical fusion at C3 through C7 by Dr. Lovell Sheehan.  He has a history of stable cervical myelomalacia.  He was last seen by  me on June 17.  In the interim, he is continued to participate in  physical therapy program working on trunk strengthening, as well as  range of motion of the lower extremities.   His average pain is about 7 or 8 on a scale of 10, localized to the  periscapular area and down predominantly in left upper extremity.  Pain  in the lower lumbar spine radiating down both lower extremities.  The  pain is described as sharp, burning, stabbing, and aching.  Sleep tends  to be poor.  The pain is worse with activities, walking, bending,  standing and improves with heat and medications.  He reports good relief  with current meds prescribed through this clinic.   Functional status is as follows.  [name] is able to walk at  least 20 minutes at a time now.  He is able to drive, has some  difficulty with stairs.  He is independent with self care for the most  part and needs some assistance with meal prep.  He denies problems  controlling bowel or bladder.  Reports occasional confusion, but denies  depression, anxiety, or suicidal ideation, evidence of lower extremity  spasm.  He reports overall poor appetite.  Otherwise, review of system  is negative.  No changes in past medical, social, or family history.   I have recommended he obtain a primary care physician.  He states he is  having trouble finding one.  He has Medicaid status, however, he states  that the places that he is called to get in with a primary care  physician state that they do not have any openings for Medicaid patients  at this time.   PHYSICAL EXAMINATION:  VITAL SIGNS:  Blood pressure is 120/71, pulse 53,  respirations 20, and 97% saturated on room air.  GENERAL:  [name] is a thin adult gentleman who does not appear  in any distress.  He is oriented x3.  His speech is clear.  His affect  is bright.  He is alert, cooperative, and pleasant.  He follows commands  without difficulty.  NEUROLOGIC:  Cranial nerves are grossly intact.  Coordination is grossly  intact.  Reflexes are 2+.  Patellar tendon 0.  The Achilles tendon  sensation is patchy in the upper and lower extremities.   He has limitations in cervical range of motion, which is quite  significant with respect to rotation, as well as flexion, extension,  lateral flexion.  Lumbar spine is also limited in all planes as well.  However, his hamstrings, which he had difficulty extending his legs  fully while sitting on the exam table last month, he now is able to  extend both knees fully.  His hamstrings have improved over the last  month.   His motor strength is good in the lower extremities without focal  deficit.  He is less tender over the trochanters today as well.   ASSESSMENT:  1.  Overall improvement in trochanteric bursitis and iliotibial band      syndrome.  2. Overall improvement in hamstring length, improved flexibility is      noted today.  3. Lumbago.  4. Cervicalgia.  5. Stable cervical myelomalacia.  6. Status post C3 through C7 fusion, Dr. Lovell Sheehan.  7. History of sensory deficits in the upper extremities, status post      ulnar transposition on the left remotely.   PLAN:  We will refill his Flexeril 10 mg 1 p.o. q.p.m. p.r.n., muscle  spasm back, #30, 3 refills.  He does not need any refills on the  Lidoderm, Prilosec, or Neurontin at this time, nor the Mobic.  He will  require refills of these in August.  I encouraged him to continue his   flexibility program and strengthening program provided to him through  his physical therapist.  He does have a home program, which he can  continue to engage in.  He states he will comply.  We will see him back  in a month and refill medications at next month's visit.           ______________________________  Brantley Stage, M.D.     DMK/MedQ  D:  06/13/2008 13:42:04  T:  06/14/2008 08:43:02  Job #:  [id number]

## 2011-04-12 NOTE — Assessment & Plan Note (Signed)
Craig Orr is a 45 year old African American gentleman who was  last seen by me back on September 12, 2007.  He is being followed in our  pain and rehabilitative clinic for chronic neck pain.  He also gets some  intermittent low back pain as well.   He has a history of a fusion from C3 to C7 by Dr. Lovell Sheehan.   His pain has been fairly well-controlled with the use of some Neurontin  and ibuprofen over the last year.   He is back in today for a refill of his medications.   In the interim, since his last visit, he states he has not had any new  health problems.  He has not had any emergency room visits, urgent care  visits.  No new medications have been prescribed.   He has been taking his medications as prescribed and has not noted any  significant problems with side effects, no abdominal pain, no over-  sedation, no problems with balance.   Average pain is about a 7 to 8 on a scale of 10.  He is currently  getting fair relief with the current meds.   He reports his biggest complaint today is some low back pain.  Worse  with prolonged sitting and bending movements.  Pain improves with rest  and medication.   He is able to walk about 30 minutes at a time.  He does so each day as  some exercise.  He is independent with his self-care as well as higher  level household duties.  He admits to occasional numbness and tingling,  especially in the left upper extremity.  Denies depression, anxiety,  suicidal ideation.  Denies problems controlling bowel or bladder.   Again, no changes in past medical, social or family history since last  visit.   PHYSICAL EXAMINATION:  VITAL SIGNS:  Weight today is 162.  He is  approximately 10 pounds from his last visit, which was at 152.4.  Blood  pressure is 119/54, pulse 59, respirations 18, 98% saturated on room  air.  GENERAL:  He is a well-developed, well-nourished gentleman who does not  appear in any distress.  He is oriented x3.  Speech is  clear.  His  affect is bright.  He is alert, he is cooperative and pleasant, and he  follows commands without difficulty.  Transitioning from sitting to  standing is done with ease.  Gait in the room is nonantalgic.  Balance  is good.  Tandem gait and Romberg's test performed adequately.  MUSCULOSKELETAL:  Limitations noted in cervical range of motion in all  planes.  Full shoulder range of motion is noted as well.  He also has  some mild limitation in lumbar motion, especially with forward flexion.  No sensory deficits are appreciated in the lower extremities.  His motor  strength is good in both upper and lower extremities without focal  deficit.  Reflexes are symmetric and intact in both upper and lower  extremities.  No clonus is noted.  No abnormal tone is noted.  Mild  tenderness is noted with palpation in the lumbar paraspinal musculature  bilaterally.   IMPRESSION:  1. Mild lumbago.  2. Cervicalgia status post C3-C4, C4-C5, C5-C6, C6-C7 fusion by Dr.      Lovell Sheehan.  3. Stable cervical mild malacia.  4. Sensory deficits in left upper extremity status post ulnar      transposition remotely.  5. Mild lumbago.   PLAN:  Reviewed using proper body  mechanics for bending, lifting and  sitting with Craig Orr today.  We will also give him a  prescription for a flexible lumbar with Velcro closure.  Encouraged him  to continue to walk at least twice a day for 30 minutes.  We will refill  the following medications for him:  Prilosec 20 mg one p.o. daily #30;  ibuprofen 600 mg one p.o. q.a.m. #30; Neurontin 400 mg one p.o. q.i.d.  #120.  Three refills on each of these.   Continue to encourage him to stay active, however, within his range of  motion since he is limited, especially with cervical range of motion.  We will encourage him to walk each day.  He has gained approximately 10  pounds in the last month.  We had been concerned about some weight loss,  however, he has done  well over the last couple of months.   We will see him back in 4 months.  We will continue to manage him non-  narcotically in light of positive urine drug screen for [facility] in the past.           ______________________________  Brantley Stage, M.D.     DMK/MedQ  D:  12/05/2007 09:29:39  T:  12/05/2007 10:06:15  Job #:  [id number]

## 2011-04-12 NOTE — Assessment & Plan Note (Signed)
[name] is a 45 year old African American gentleman who is being seen  in our pain and rehabilitative clinic  for chronic cervicalgia and  lumbago.   He is back in today and is in for brief recheck.   Last month he was trialed on Lidoderm and Neurontin and a cervical  collar.   He states that the cervical collar and the Lidoderm do help quite a bit.   He states he is also set up for some extensive dental work in the next  day and was told by his dentist to discontinue several of his  medications, including Neurontin, prior to the multiple extractions  which he is apparently going to undergo.   He states his average pain has been about a 7 or a 8 on a scale of 10.  Pain typically localized to the posterior cervical region and shoulder  region.  Often has some complaints of low back pain and lower extremity  discomfort, achiness today.   Average pain is about a 7-8 on a scale of 10.  It was a little worse  this morning.  He got up and was fairly stiff.   He reports his pain as being rather constant, some tingling and aching  into the upper extremities, sharp in nature.   He is able to walk between 15 and 20 minutes at a time.  He is  independent with his self care, denies bowel and bladder control  problems.  Denies suicidal ideation, denies depression or anxiety.   Past medical, social, family history otherwise noncontributory.   Medications provided by this clinic include:  1. Neurontin 300 mg 1 p.o. t.i.d.; however, the patient is currently      not taking it secondary to his dentist requesting he stay off of it      until after his surgery.  2. Lidoderm on a p.r.n. basis.   EXAMINATION:  Blood pressure 118/68, pulse 60, respirations 16, 98%  saturated on room air.  He is a thin adult [place] American gentleman who appears his stated  age.  He is oriented x3, speech is clear. Affect is bight, alert,  cooperative and pleasant.  He transitions from sit to stand without  difficulty.  His gait in the  room appears to be somewhat antalgic today.  He is somewhat flexed at  the hips just slightly and appears a bit stiff as he walks.  His gait  does smooth out after several steps in the room.   Tandem gait, Romberg's test were performed adequately.   He has significant limitation in cervical range of motion with respect  to rotation, extension and flexion.  He does report some discomfort as  he performs these maneuvers.   His reflexes are slightly brisk throughout the upper and lower  extremities.  No abnormal tone is noted.  No clonus is noted today.  His  motor strength is good in both upper and lower extremities.  He does  report numbness throughout both upper extremities which is not new.   IMPRESSION:  1. Status post C3-4, C4-5, C5-6, add-on C6-7 fusion by Dr. Lovell Sheehan.  2. Stable cervical myelomalacia.  3. Cervicalgia.  4. Sensory deficits in upper extremities, especially with hands      bilaterally.  5. Upper and lower extremity spasms, currently not noted on exam      today.   PLAN:  Continue soft collar on a p.r.n. basis, continue Lidoderm p.r.n.  Once he is completed with his dental work we  will reinstate Neurontin,  consider Flexeril.  I have already educated him on using acetaminophen.  I did caution him if his dentist put him on any pain medications to  watch the amount of acetaminophen he is taking, it should be less than  4000 mg per day.  He will look into obtaining a cervical pillow for the  evening and night.  We will see him back in a month.           ______________________________  Brantley Stage, M.D.     DMK/MedQ  D:  04/19/2007 09:47:29  T:  04/19/2007 10:14:50  Job #:  [id number]

## 2011-04-15 NOTE — Op Note (Signed)
NAME:  Craig Orr, Craig Orr                        ACCOUNT NO.:  [id number]   MEDICAL RECORD NO.:  [id number]                   PATIENT TYPE:  INP   LOCATION:  3013                                 FACILITY:  [facility]   PHYSICIAN:  Cristi Loron, M.D.            DATE OF BIRTH:  Jan 15, 1966   DATE OF PROCEDURE:  04/15/2004  DATE OF DISCHARGE:                                 OPERATIVE REPORT   BRIEF HISTORY:  The patient is a 45 year old black male who has suffered  from neck and bilateral arm pain.  He failed medical management and was  worked up with a cervical MRI that demonstrated the patient to have a large  herniated disc at c6-C7.  I discussed the various treatment options with the  patient including surgery.  The patient weighed the risks, benefits, and  alternatives of surgery and decided to proceed with a C6-C7 anterior  cervical discectomy, fusion, and plating.  Of note, the patient has had a  prior C3-C4, C4-C5, and C5-C6 anterior cervical discectomy.   PREOPERATIVE DIAGNOSIS:  C6-C7 herniated nucleus pulposus, spinal stenosis,  cervicalgia, cervical radiculopathy.   POSTOPERATIVE DIAGNOSIS:  C6-C7 herniated nucleus pulposus, spinal stenosis,  cervicalgia, cervical radiculopathy.   PROCEDURE:  C6-C7 extensive anterior cervical discectomy/decompression;  interbody iliac crest allograft arthrodesis; anterior cervical plating.   SURGEON:  Cristi Loron, M.D.   ASSISTANT:  Coletta Memos, M.D.   ANESTHESIA:  General endotracheal anesthesia.   ESTIMATED BLOOD LOSS:  50 mL.   SPECIMENS:  None.   DRAINS:  None.   COMPLICATIONS:  None.   DESCRIPTION OF PROCEDURE:  The patient was brought to the operating room by  the anesthesia team.  General endotracheal anesthesia was carefully induced.  The patient remained in the supine position.  A roll was placed under his  shoulders to place his neck in slight extension.  His anterior cervical  region was then prepared with  Betadine scrub and Betadine solution.  Sterile  drapes were applied.  I then injected the area to be incised with Marcaine  with epinephrine solution.  I used a scalpel to make a transverse incision  in the patient's right anterior neck.  I used the Metzenbaum scissors to  divide the platysmal muscle and to dissect medial to the sternocleidomastoid  muscle, jugular vein, and carotid artery.  I dissected through the scar  tissue from the previous operations, carefully identified the esophagus and  retracted it medially and then used the Kitner swabs to clear the soft  tissue from the anterior cervical spine.  I then inserted a bent spinal  needle into the exposed intravertebral disc space.  We then obtained an  interoperative radiograph to confirm our location.  We then used the  electrocautery to detach the medial border of the longus colli muscles  bilaterally from C6-C7 intervertebral disc space.  We inserted the Caspar  self-retaining retractor for exposure.  We incised  the C6-C7 intervertebral  disc space.  We performed a partial discectomy using the pituitary forceps  and the Carlens curets.  We then distracted the interspace with the  interbody spreader and used the high speed drill to decorticate the  vertebral endplates of C6-C7, to drill away the remainder of the C6-C7  intervertebral disc, and to thin out the posterior longitudinal ligament as  well as to draw off some posterior spondylosis.  We then incised the thinned  out ligament with the knife and then removed it with the Kerrison punch  under cutting the vertebral endplates of C6-C7, decompressing the thecal  sac.  We then performed a generous foraminotomy about the bilateral C7 nerve  roots.   Having completed the decompression, we now turned our attention to the  arthrodesis.  We obtained iliac crest tricortical allograft bone graft and  fashioned it into the appropriate dimensions, 9 mm in height and 1 cm in  depth. We  inserted the bone graft and distracted C6-C7 interspace and then  removed the distractor.  There was a good snug fit of the bone graft.  We  now turned our attention to the anterior spine instrumentation.  We used the  high speed drill to drill away spondylosis from the vertebral endplates at  C6-C7 so that the plate would lay down flat.  We selected the appropriate  length anterior cervical plate (Codman slit lock titanium plate and screws).  We drilled two holes at C6 and two at C7.  We secured the plate by placing  two 14 mm screws at C6 and two at C7.  We then obtained an interoperative  radiograph, we could not see the plate or screws very well because of the  patient's body habitus, but it looked good en vivo, so we secured the screws  to the plate by locking each.   We then obtained stringent hemostasis using bipolar electrocautery.  We  copiously irrigated the wound out with Bacitracin solution and removed the  solution.  We then removed the Caspar self-retaining retractor.  We  inspected the esophagus for any damage and there was none apparent.  We then  reapproximated the patient's platysmal muscle with interrupted 3-0 Vicryl  suture, the subcutaneous tissue with interrupted 3-0 Vicryl suture, and the  skin with Steri-Strips and Benzoin.  The wound was then coated with  Bacitracin ointment.  A sterile dressing was applied, the drapes were  removed.  The patient was subsequently extubated by the anesthesia team and  transported to the post anesthesia care unit in stable condition.  All  sponge, instrument and needle counts were correct at the end of the case.                                               Cristi Loron, M.D.    JDJ/MEDQ  D:  04/15/2004  T:  04/16/2004  Job:  [id number]

## 2011-04-15 NOTE — Group Therapy Note (Signed)
Craig Orr is a 45 year old African American gentleman who presents  to our pain and rehabilitative clinic as a referral from Dr. Lovell Sheehan.  Mr. Frappier has a long history of neck pain dating back into the  early 1990s.  He has undergone 3 cervical spine procedures dating to  1991, 1994, and his last procedure by Dr. Lovell Sheehan in May 2005.   He has had C3/4, C4/5, C5/6, and lastly a C6/7 fusion.   His chief complaint today is posterior neck pain which radiates to  bilateral scapulae down to his low back, and he has some intermittent  leg discomfort at times as well.  His chief complaint, however, is  cervicalgia.   He states his pain is fairly constant, about a 7 to an 8 on a scale of  10.  In fact, today in the office he states he is a 7 on a scale of 10.  His pain is described as dull, stabbing, tingling, and aching, worse  when he has to sit for a long period of time.  It seems to improve if he  gets up and moves around.   Nighttime is particularly difficult for him.  He uses 3 different  pillows at night to obtain some level of comfort.   He states over the years his predominant medication that has been  prescribed for pain management has been hydrocodone and diazepam.  He  gets fair to good relief with this current medication regime.  He has  difficulty with stairs, but is able to drive independently.  He can walk  for about 30 minutes at a time.  He is independent with his self-care.  He does help out with some household duties at home such as folding  clothes and some light meal preparation.   He has applied for disability from Medicaid.  He has a high school  diploma.  His last job was back in 2001.  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   He denies bowel or bladder control problems.  He does admit to numbness  and tingling.  In fact, states that he tries to avoid kitchen work  because he cannot always tell if he has burned himself or cut himself in  his hands.   He admits to depression, but denies suicidal ideation.   REVIEW OF SYSTEMS:  Otherwise, generally is negative.   He does not have a primary care physician.   PAST MEDICAL HISTORY:  Negative.   PAST SURGICAL HISTORY:  Positive for cervical fusion in 1991, 1994,  2005.  He has also had some left elbow surgery in 1994 by Dr. Renae Fickle.   He is divorced.  He lives with his girlfriend.  He has a 47 year old and  a 65 year old who live in the home as well as a 71-month-old grandchild.  He drinks 1 to 2 beers every other day or so.  He smokes 3 cigarettes a  week.  He admits to marijuana use on occasion.  He states the last time  he used it was in February near his birthday.   FAMILY HISTORY:  His mother and father died decades ago.  He does not  know the reason for their deaths.  He states he does have a family  history of heart disease and high blood pressure, however.  He has 5  brothers and 2 sisters who are living, 2 brothers have passed away.  In  fact, 1 earlier this morning, apparently with some liver problems  and  abdominal complications.   MEDICATIONS:  Patient states that he is currently taking:  1. Diazepam 5 mg up to 3 times a day.  2. Hydrocodone 10/500 up to 4 times a day prescribed by Dr. Lovell Sheehan.  3. Ibuprofen 800 mg prescribed by Dr. Felicie Morn.   HE REPORTS NO KNOWN DRUG ALLERGIES.   MRI, which is sent over by Dr. Lovell Sheehan, was done at [facility]  August 13, 2006, cervical spine read by Carey Bullocks, showed:  1. Solid anterior fusion C3 to C7.  2. 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  3. No significant spinal stenosis or nerve root encroachment.  4. Bilateral small disk protrusion at T1/2, which appeared stable      without nerve root encroachment.   EXAMINATION:  Blood pressure is 125/76.  Pulse 55.  Respirations 16.  He  is 100% saturated on room air.  He is a thin adult gentleman who does not appear in any  distress, and  appears his stated.   He is oriented x3.  His speech is clear.  His affect is bright and  alert.  He is cooperative and pleasant.  He follows commands without any  difficulties.   Transitioning from sit to stand, he uses his upper extremities to push  off.  He states he does have some trouble getting out of the chair.   His gait in the room is slow and deliberate.  His stride length is  symmetric, however.  He is able to perform tandem gait and Romberg's  test adequately.  He does display just a slight amount of trouble, but  is able to self-correct reasonably well.   He has significant limitations in cervical range of motion, not much  more than 10% of normal range with rotation to the right and left.  Essentially no extension is appreciated today.  He does have some degree  of forward flexion, and very little lateral flexion.  He is able to  abduct his upper extremities to approximately 90 degrees, not much  beyond that.  With forward flexion he is able to forward flex to  approximately 160 degrees.   His upper extremities and lower extremities reflexes are brisk.  They  are present.  Toes are equivocal with respect to Babinski.  He has no  clonus appreciated today.  He states he did take his Valium before he  came, however.   He reports diminished sensation throughout both upper extremities,  especially in the hands, and he reports patchy decreased sensation  throughout bilateral lower extremities.  Probe perception is intact in  the lower extremities.   Muscle bulk is good in both upper and lower extremities.  No focal  wasting is appreciated.  No fasciculations are appreciated.  His motor  strength in general is in the 5/5 range with the exception of left hand  intrinsics are 4-/5.  He also had some giveaway weakness in both hip  flexors, but with verbal coaching was able to maintain a grade 5- with  manual muscle testing.   IMPRESSION: 1. Cervicalgia.   2. Sensory deficits in upper extremities, especially in hands      bilaterally.  3. Complaints of upper and lower extremity spasms not noted on exam      today.  4. Status post C3/4, C4/5, C5/6, and add on C6/7 fusion by Dr. Lovell Sheehan      Apr 15, 2004.  5. Complaints of subjective hip  extensor weakness.   PLAN:  1. We will check urine drug screen.  2. Will trial on Lidoderm and Neurontin.  He is going to be given a      prescription of each, and nursing staff reviewed how to use these      medications.  3. He will trial soft collar in the evening to see if we can improve      his sleep.  4. We will see him back in a month.  Anticipate filling his previous      pain medications next month.           ______________________________  Brantley Stage, M.D.     DMK/MedQ  D:  03/01/2007 12:15:27  T:  03/01/2007 13:02:55  Job #:  [id number]

## 2011-04-27 ENCOUNTER — Other Ambulatory Visit (HOSPITAL_COMMUNITY): Payer: Self-pay | Admitting: Family Medicine

## 2011-04-27 ENCOUNTER — Ambulatory Visit (HOSPITAL_COMMUNITY)
Admission: RE | Admit: 2011-04-27 | Discharge: 2011-04-27 | Disposition: A | Discharge status: Home / Self Care | Payer: Medicaid Other | Source: Ambulatory Visit | Attending: Family Medicine | Admitting: Family Medicine

## 2011-04-27 DIAGNOSIS — M19019 Primary osteoarthritis, unspecified shoulder: Secondary | ICD-10-CM | POA: Insufficient documentation

## 2011-04-27 DIAGNOSIS — M25519 Pain in unspecified shoulder: Secondary | ICD-10-CM | POA: Insufficient documentation

## 2011-04-27 DIAGNOSIS — M25511 Pain in right shoulder: Secondary | ICD-10-CM

## 2011-07-27 ENCOUNTER — Other Ambulatory Visit (HOSPITAL_COMMUNITY): Payer: Self-pay | Admitting: Family Medicine

## 2011-07-27 ENCOUNTER — Ambulatory Visit (HOSPITAL_COMMUNITY)
Admission: RE | Admit: 2011-07-27 | Discharge: 2011-07-27 | Disposition: A | Discharge status: Home / Self Care | Payer: Medicaid Other | Source: Ambulatory Visit | Attending: Family Medicine | Admitting: Family Medicine

## 2011-07-27 DIAGNOSIS — M51379 Other intervertebral disc degeneration, lumbosacral region without mention of lumbar back pain or lower extremity pain: Secondary | ICD-10-CM | POA: Insufficient documentation

## 2011-07-27 DIAGNOSIS — M545 Low back pain, unspecified: Secondary | ICD-10-CM | POA: Insufficient documentation

## 2011-07-27 DIAGNOSIS — M5137 Other intervertebral disc degeneration, lumbosacral region: Secondary | ICD-10-CM | POA: Insufficient documentation

## 2011-08-11 ENCOUNTER — Encounter: Payer: Self-pay | Admitting: Emergency Medicine

## 2011-08-11 ENCOUNTER — Emergency Department (HOSPITAL_COMMUNITY)
Admission: EM | Admit: 2011-08-11 | Discharge: 2011-08-11 | Disposition: A | Discharge status: Home / Self Care | Payer: Medicaid Other | Attending: Emergency Medicine | Admitting: Emergency Medicine

## 2011-08-11 DIAGNOSIS — M436 Torticollis: Secondary | ICD-10-CM | POA: Insufficient documentation

## 2011-08-11 MED ORDER — HYDROCODONE-ACETAMINOPHEN 7.5-325 MG PO TABS: ORAL_TABLET | ORAL | Status: DC

## 2011-08-11 NOTE — ED Provider Notes (Signed)
History     CSN: [id number] Arrival date & time: 08/11/2011  7:58 AM  Chief Complaint  Patient presents with  . Neck Pain   Patient is a 45 y.o. male presenting with neck pain. The history is provided by the patient.  Neck Pain  This is a recurrent problem. The problem occurs daily. The problem has been gradually worsening. The pain is present in the generalized neck. The quality of the pain is described as aching. The pain is at a severity of 8/10. The pain is severe. Stiffness is present all day. Pertinent negatives include no photophobia, no visual change, no chest pain, no syncope, no headaches, no bowel incontinence, no bladder incontinence, no leg pain and no paresis. He has tried analgesics for the symptoms. The treatment provided no relief.    History reviewed. No pertinent past medical history.  Past Surgical History  Procedure Date  . Arm surgery   . Neck surgery     History reviewed. No pertinent family history.  History  Substance Use Topics  . Smoking status: Never Smoker   . Smokeless tobacco: Not on file  . Alcohol Use: Yes      Review of Systems  HENT: Positive for neck pain.   Eyes: Negative for photophobia.  Cardiovascular: Negative for chest pain and syncope.  Gastrointestinal: Negative for bowel incontinence.  Genitourinary: Negative for bladder incontinence.  Musculoskeletal:       Neck pain  Neurological: Negative for headaches.    Physical Exam  BP 135/86  Pulse 53  Temp(Src) 97.3 F (36.3 C) (Oral)  Resp 17  Ht 5\' 10"  (1.778 m)  Wt 156 lb (70.761 kg)  BMI 22.38 kg/m2  SpO2 100%  Physical Exam  Nursing note and vitals reviewed. Constitutional: He is oriented to person, place, and time. He appears well-developed and well-nourished.  Non-toxic appearance.  HENT:  Head: Normocephalic.  Right Ear: Tympanic membrane and external ear normal.  Left Ear: Tympanic membrane and external ear normal.  Eyes: EOM and lids are normal. Pupils are  equal, round, and reactive to light.  Neck: Normal range of motion. Neck supple. Carotid bruit is not present.       Left neck very tight and tense. No bruits. No swollen lymph nodes. Not hot.  Cardiovascular: Normal rate, regular rhythm, normal heart sounds, intact distal pulses and normal pulses.   Pulmonary/Chest: Breath sounds normal. No respiratory distress.  Abdominal: Soft. Bowel sounds are normal. There is no tenderness. There is no guarding.  Musculoskeletal: Normal range of motion.  Lymphadenopathy:       Head (right side): No submandibular adenopathy present.       Head (left side): No submandibular adenopathy present.    He has no cervical adenopathy.  Neurological: He is alert and oriented to person, place, and time. He has normal strength. No cranial nerve deficit or sensory deficit.       Grip and sensory wnl. Strength of upper ext symmetrical.  Skin: Skin is warm and dry.  Psychiatric: He has a normal mood and affect. His speech is normal.    ED Course: Plan discussed with pt. Pt to see Dr Gerlene Fee for recheck if not improving.  Procedures  MDM I have reviewed nursing notes, vital signs, and all appropriate lab and imaging results for this patient.      Kathie Dike, [place] 08/11/11 [phone]  History/physical exam/procedure(s) were performed by non-physician practitioner and as supervising physician I was immediately available for consultation/collaboration. I  have reviewed all notes and am in agreement with care and plan.   Hilario Quarry, MD 08/13/11 [phone]

## 2011-08-11 NOTE — ED Notes (Signed)
C/o neck pain onset this a.m. When he awakened.  No injury--Has had several neck surgeries and has similar bouts from time to time.  Rates pain 7 on 1-10 scale

## 2011-08-11 NOTE — ED Notes (Signed)
Pt states he has had several neck surgeries and for the last 3 days he has been having severe neck pain. Pt denies any injury.

## 2011-08-16 ENCOUNTER — Ambulatory Visit (INDEPENDENT_AMBULATORY_CARE_PROVIDER_SITE_OTHER): Payer: Medicaid Other | Admitting: Orthopedic Surgery

## 2011-08-16 VITALS — BP 124/82 | Ht 71.0 in | Wt 162.0 lb

## 2011-08-16 DIAGNOSIS — S4990XA Unspecified injury of shoulder and upper arm, unspecified arm, initial encounter: Secondary | ICD-10-CM

## 2011-08-16 DIAGNOSIS — S4980XA Other specified injuries of shoulder and upper arm, unspecified arm, initial encounter: Secondary | ICD-10-CM

## 2011-08-16 NOTE — Progress Notes (Signed)
45 year old male with chronic pain in his RIGHT shoulder secondary to an injury several years ago. He complains of popping and pain over the RIGHT a.c. Joint  The patient reports difficulty with filling out his form so he basically has chronic pain as a dull ache which is intermittent and related to certain positions in the RIGHT shoulder.  No numbness or tingling  Review of systems of fatigue redness in his eyes shortness of breath tightness of the chest heartburn constipation dizziness anxiety depression temperature intolerance to heat and seasonal ALLERGY    No past medical history on file.  Past Surgical History  Procedure Date  . Arm surgery   . Neck surgery     History   Social History  . Marital Status: Divorced    Spouse Name: N/A    Number of Children: N/A  . Years of Education: N/A   Occupational History  . Not on file.   Social History Main Topics  . Smoking status: Never Smoker   . Smokeless tobacco: Not on file  . Alcohol Use: Yes  . Drug Use: No  . Sexually Active:    Other Topics Concern  . Not on file   Social History Narrative  . No narrative on file    Evaluation of the RIGHT shoulder shows that there is a subluxating dislocating RIGHT a.c. Joint with tenderness over the distal clavicle and prominence of the distal clavicle. Forward elevation is limited to 90 in the scapular plane. External rotation is evaluated and found to be 50.  Painful range of motion of the shoulders noted on forward elevation, but not internal and external rotation. There is normal. Muscle tone to the deltoid muscles in the biceps and triceps. No scapular atrophy.  Impression subluxating dislocating, acromioclavicular joint.  Recommend consultation with a shoulder specialist to see if this can be reconstructed. I've advised the patient that this may not be fixable.

## 2011-08-17 ENCOUNTER — Other Ambulatory Visit: Payer: Self-pay | Admitting: Orthopedic Surgery

## 2011-08-17 ENCOUNTER — Encounter: Payer: Self-pay | Admitting: Orthopedic Surgery

## 2011-08-17 DIAGNOSIS — S43111A Subluxation of right acromioclavicular joint, initial encounter: Secondary | ICD-10-CM

## 2011-08-22 ENCOUNTER — Telehealth: Payer: Self-pay | Admitting: Radiology

## 2011-08-22 NOTE — Telephone Encounter (Signed)
I faxed a referral for this patient to Dr. Dion Saucier to be seen for subluxation of right acromioclavicular joint.

## 2011-08-25 ENCOUNTER — Telehealth: Payer: Self-pay | Admitting: Orthopedic Surgery

## 2011-08-25 NOTE — Telephone Encounter (Signed)
Call received from [place] at [facility] regarding status of referral appointment. She states they have made 3 attempts to reach patient at ph# [phone].  I have left message at contact ph# for daughter Cranford Mon [phone] to return call.

## 2011-08-29 ENCOUNTER — Telehealth: Payer: Self-pay | Admitting: Orthopedic Surgery

## 2011-08-29 NOTE — Telephone Encounter (Signed)
Mr. Keller called this morning to ask about his referral appointment, said his telephone has been out until today. Told him that [place] at [facility] has been trying to reach him.  Gave him their number and  he said he will call them and let us know  About his appointment.

## 2011-08-29 NOTE — Telephone Encounter (Signed)
Mr. Gassert called back and his appointment at [facility] is 08/31/11 with Dr. Dion Saucier.

## 2011-08-29 NOTE — Telephone Encounter (Signed)
See separate phone note dated 08/29/11. Patient's phone had been out. He has called [facility] and has appointment scheduled for 09/10/11.

## 2011-10-04 ENCOUNTER — Emergency Department (HOSPITAL_COMMUNITY)
Admission: EM | Admit: 2011-10-04 | Discharge: 2011-10-04 | Disposition: A | Discharge status: Home / Self Care | Payer: Medicaid Other | Attending: Emergency Medicine | Admitting: Emergency Medicine

## 2011-10-04 ENCOUNTER — Other Ambulatory Visit: Payer: Self-pay

## 2011-10-04 ENCOUNTER — Encounter (HOSPITAL_COMMUNITY): Payer: Self-pay | Admitting: Emergency Medicine

## 2011-10-04 DIAGNOSIS — T423X1A Poisoning by barbiturates, accidental (unintentional), initial encounter: Secondary | ICD-10-CM | POA: Insufficient documentation

## 2011-10-04 DIAGNOSIS — R5381 Other malaise: Secondary | ICD-10-CM | POA: Insufficient documentation

## 2011-10-04 DIAGNOSIS — T424X1A Poisoning by benzodiazepines, accidental (unintentional), initial encounter: Secondary | ICD-10-CM | POA: Insufficient documentation

## 2011-10-04 DIAGNOSIS — T50901A Poisoning by unspecified drugs, medicaments and biological substances, accidental (unintentional), initial encounter: Secondary | ICD-10-CM

## 2011-10-04 DIAGNOSIS — R404 Transient alteration of awareness: Secondary | ICD-10-CM | POA: Insufficient documentation

## 2011-10-04 DIAGNOSIS — I498 Other specified cardiac arrhythmias: Secondary | ICD-10-CM | POA: Insufficient documentation

## 2011-10-04 DIAGNOSIS — T423X4A Poisoning by barbiturates, undetermined, initial encounter: Secondary | ICD-10-CM | POA: Insufficient documentation

## 2011-10-04 DIAGNOSIS — T424X4A Poisoning by benzodiazepines, undetermined, initial encounter: Secondary | ICD-10-CM | POA: Insufficient documentation

## 2011-10-04 DIAGNOSIS — G8929 Other chronic pain: Secondary | ICD-10-CM | POA: Insufficient documentation

## 2011-10-04 DIAGNOSIS — R5383 Other fatigue: Secondary | ICD-10-CM | POA: Insufficient documentation

## 2011-10-04 LAB — COMPREHENSIVE METABOLIC PANEL
ALT: 13 U/L (ref 0–53)
Alkaline Phosphatase: 72 U/L (ref 39–117)
CO2: 33 mEq/L — ABNORMAL HIGH (ref 19–32)
Calcium: 9.5 mg/dL (ref 8.4–10.5)
Chloride: 102 mEq/L (ref 96–112)
GFR calc Af Amer: 72 mL/min — ABNORMAL LOW (ref >90)
GFR calc non Af Amer: 62 mL/min — ABNORMAL LOW (ref >90)
Glucose, Bld: 86 mg/dL (ref 70–99)
Potassium: 3.5 mEq/L (ref 3.5–5.1)
Sodium: 140 mEq/L (ref 135–145)
Total Bilirubin: 0.1 mg/dL — ABNORMAL LOW (ref 0.3–1.2)

## 2011-10-04 LAB — ACETAMINOPHEN LEVEL
Acetaminophen (Tylenol), Serum: <15 ug/mL (ref 10–30)
Acetaminophen (Tylenol), Serum: <15 ug/mL (ref 10–30)

## 2011-10-04 LAB — CBC
MCV: 84.4 fL (ref 78.0–100.0)
Platelets: 253 10*3/uL (ref 150–400)
RBC: 4.88 MIL/uL (ref 4.22–5.81)
WBC: 4.2 10*3/uL (ref 4.0–10.5)

## 2011-10-04 LAB — DIFFERENTIAL
Eosinophils Relative: 1 % (ref 0–5)
Lymphocytes Relative: 56 % — ABNORMAL HIGH (ref 12–46)
Lymphs Abs: 2.4 10*3/uL (ref 0.7–4.0)

## 2011-10-04 LAB — RAPID URINE DRUG SCREEN, HOSP PERFORMED: Barbiturates: NOT DETECTED

## 2011-10-04 LAB — SALICYLATE LEVEL: Salicylate Lvl: <2 mg/dL — ABNORMAL LOW (ref 2.8–20.0)

## 2011-10-04 LAB — GLUCOSE, CAPILLARY: Glucose-Capillary: 74 mg/dL (ref 70–99)

## 2011-10-04 MED ORDER — NALOXONE HCL 0.4 MG/ML IJ SOLN
0.4000 mg | Freq: Once | INTRAMUSCULAR | Status: AC
  Administered 2011-10-04: 0.4 mg via INTRAVENOUS
  Filled 2011-10-04: qty 1

## 2011-10-04 MED ORDER — SODIUM CHLORIDE 0.9 % IV BOLUS (SEPSIS)
500.0000 mL | Freq: Once | INTRAVENOUS | Status: AC
  Administered 2011-10-04: 1000 mL via INTRAVENOUS

## 2011-10-04 NOTE — ED Notes (Signed)
Pt reports taking 2-3 Lorazepam, Cymbalta, Tramadol, Diazepam.  Pt states that he took everything early this a.m.  Pt reports " i just took them like they told me to".  Pt reports "i'm tired of living, I'm just tired".  Pt denies an actual plan to harm himself.  Pt denies that taking the meds today was a suicide attempt.

## 2011-10-04 NOTE — ED Notes (Signed)
Pt sitting up and eating dinner.

## 2011-10-04 NOTE — Progress Notes (Signed)
Assessment Note   Craig Orr is an 45 y.o. male.   Axis I: Major Depression, Rec Axis II: Deferred Axis III: History reviewed. No pertinent past medical history. Axis IV: problems with access to health care services Axis V: 61-70 mild symptoms  Past Medical History: History reviewed. No pertinent past medical history.  Past Surgical History  Procedure Date  . Arm surgery   . Neck surgery     Family History: History reviewed. No pertinent family history.  Social History:  reports that he has never smoked. He does not have any smokeless tobacco history on file. He reports that he drinks alcohol. He reports that he does not use illicit drugs.  Allergies: No Known Allergies  Home Medications:  Medications Prior to Admission  Medication Dose Route Frequency Provider Last Rate Last Dose  . naloxone (NARCAN) injection 0.4 mg  0.4 mg Intravenous Once Joya Gaskins, MD   0.4 mg at 10/04/11 1654  . sodium chloride 0.9 % bolus 500 mL  500 mL Intravenous Once Joya Gaskins, MD   1,000 mL at 10/04/11 1648   Medications Prior to Admission  Medication Sig Dispense Refill  . HYDROcodone-acetaminophen (NORCO) 7.5-325 MG per tablet 1 po q4h prn pain  20 tablet  0    OB/GYN Status:  No LMP for male patient.  General Assessment Data Assessment Number: 1  Living Arrangements: Alone Can pt return to current living arrangement?: Yes Is patient capable of signing voluntary admission?: Yes Transfer from: Home Referral Source:  [facility] er [id number])  Risk to self Suicidal Ideation: No Suicidal Intent: No Is patient at risk for suicide?: No Suicidal Plan?: No Access to Means: No What has been your use of drugs/alcohol within the last 12 months?: MARIJUANA Intentional Self Injurious Behavior: None Factors that decrease suicide risk: Religious beliefs Family Suicide History: No Recent stressful life event(s): Other (Comment) (MEDICAL PXS) Persecutory voices/beliefs?:  No Depression: Yes Depression Symptoms: Loss of interest in usual pleasures;Feeling worthless/self pity Substance abuse history and/or treatment for substance abuse?: Yes Suicide prevention information given to non-admitted patients:  (MARIJUANA)  Risk to Others Homicidal Ideation: No Thoughts of Harm to Others: No Current Homicidal Intent: No Current Homicidal Plan: No Access to Homicidal Means: No Identified Victim: NA History of harm to others?: No Assessment of Violence: None Noted Violent Behavior Description: NA Does patient have access to weapons?: No Criminal Charges Pending?: No Does patient have a court date: No     Cognitive Functioning Weight Loss: 1                Values / Beliefs Cultural Requests During Hospitalization: None Spiritual Requests During Hospitalization: None              Disposition:     On Site Evaluation by:   Reviewed with Physician:     Hattie Perch Winford 10/04/2011 10:15 PM

## 2011-10-04 NOTE — ED Notes (Signed)
Pt in paper scrubs, on monitor, security called.

## 2011-10-04 NOTE — ED Notes (Signed)
Pt overdosed on prescription medication-unknown medications/amounts. Pt called [facility] and stated he overdosed on pain medication.?suicide attempt.Pt lethargic. [id number].

## 2011-10-04 NOTE — ED Provider Notes (Signed)
History  Scribed for Joya Gaskins, MD, the patient was seen in APA17/APA17. The chart was scribed by Gilman Schmidt. The patients care was started at 4:30 PM.   CSN: [id number] Arrival date & time: 10/04/2011  4:16 PM   First MD Initiated Contact with Patient 10/04/11 1617      Craig Complaint  Patient presents with  . Drug Overdose     HPI Craig Orr is a 45 y.o. male who presents to the Emergency Department complaining of drug overdose. Pt reports taking Lorazepam, Cymbalta, Tramadol, Diazepam early this am. States he took them for arm, shoulder, neck, and leg pain. Pt called [facility] and stated he overdosed on pain medication. Pt is lethargic.   PMH - chronic pain  Past Surgical History  Procedure Date  . Arm surgery   . Neck surgery      History  Substance Use Topics  . Smoking status: Never Smoker   . Smokeless tobacco: Not on file  . Alcohol Use: Yes      Review of Systems  Unable to perform ROS: Other    Allergies  Review of patient's allergies indicates no known allergies.  Home Medications   Current Outpatient Rx  Name Route Sig Dispense Refill  . HYDROCODONE-ACETAMINOPHEN 7.5-325 MG PO TABS  1 po q4h prn pain 20 tablet 0    BP 148/101  Pulse 62  SpO2 99% BP 146/101  Pulse 63  SpO2 98%  BP 145/96  Pulse 62  Temp(Src) 98.6 F (37 C) (Oral)  Resp 12  SpO2 98%   Physical Exam CONSTITUTIONAL: Well developed/well nourished, somnolent, HEAD AND FACE: Normocephalic/atraumatic, no signs of trauma EYES:pupils are pinpoint  ENMT: Mucous membranes moist NECK: supple no meningeal signs CV: S1/S2 noted, no murmurs/rubs/gallops noted LUNGS: Lungs are clear to auscultation bilaterally, no apparent distress ABDOMEN: soft, nontender, no rebound or guarding GU:no cva tenderness NEURO: Pt is somnolent but arousable, moves all extremitiesx4 EXTREMITIES: pulses normal, full ROM SKIN: warm, color normal PSYCH: no abnormalities of mood noted   ED  Course  Procedures DIAGNOSTIC STUDIES: Oxygen Saturation is 99% on room air, normal by my interpretation.    COORDINATION OF CARE: 4:30PM:  - Patient evaluated by ED physician pt is somnolent. Narcan, labs, EKG, Ethanol, Acetaminophen levels, Salicylate levels ordered 5:53PM: Recheck by EDP. Pt is more awake.  7:29 PM d/w ACT, will see patient.  He will require one more APAP level 10:15 PM Pt seen by ACT, he was never suicidal.  He confused his medications and this was accidental.  He had spoke to [facility] and they suggested EMS call.  He denies SI.   Observed in ED, and mental status improved.  No signs of serotonin syndrome.  No EKG changes noted.  Otherwise labs unremarkable.     Date: 10/04/2011  Rate: 58  Rhythm: sinus bradycardia  QRS Axis: normal  Intervals: normal  ST/T Wave abnormalities: nonspecific ST changes  Conduction Disutrbances:none  Narrative Interpretation:   Old EKG Reviewed: none available at time of interpretation   Results for orders placed during the hospital encounter of 10/04/11  CBC      Component Value Range   WBC 4.2  4.0 - 10.5 (K/uL)   RBC 4.88  4.22 - 5.81 (MIL/uL)   Hemoglobin 13.8  13.0 - 17.0 (g/dL)   HCT 10.2  72.5 - 36.6 (%)   MCV 84.4  78.0 - 100.0 (fL)   MCH 28.3  26.0 - 34.0 (pg)   MCHC 33.5  30.0 - 36.0 (g/dL)   RDW 40.9  81.1 - 91.4 (%)   Platelets 253  150 - 400 (K/uL)  DIFFERENTIAL      Component Value Range   Neutrophils Relative 36 (*) 43 - 77 (%)   Neutro Abs 1.5 (*) 1.7 - 7.7 (K/uL)   Lymphocytes Relative 56 (*) 12 - 46 (%)   Lymphs Abs 2.4  0.7 - 4.0 (K/uL)   Monocytes Relative 7  3 - 12 (%)   Monocytes Absolute 0.3  0.1 - 1.0 (K/uL)   Eosinophils Relative 1  0 - 5 (%)   Eosinophils Absolute 0.0  0.0 - 0.7 (K/uL)   Basophils Relative 0  0 - 1 (%)   Basophils Absolute 0.0  0.0 - 0.1 (K/uL)  COMPREHENSIVE METABOLIC PANEL      Component Value Range   Sodium 140  135 - 145 (mEq/L)   Potassium 3.5  3.5 - 5.1 (mEq/L)    Chloride 102  96 - 112 (mEq/L)   CO2 33 (*) 19 - 32 (mEq/L)   Glucose, Bld 86  70 - 99 (mg/dL)   BUN 8  6 - 23 (mg/dL)   Creatinine, Ser 7.82  0.50 - 1.35 (mg/dL)   Calcium 9.5  8.4 - 95.6 (mg/dL)   Total Protein 7.3  6.0 - 8.3 (g/dL)   Albumin 3.8  3.5 - 5.2 (g/dL)   AST 15  0 - 37 (U/L)   ALT 13  0 - 53 (U/L)   Alkaline Phosphatase 72  39 - 117 (U/L)   Total Bilirubin 0.1 (*) 0.3 - 1.2 (mg/dL)   GFR calc non Af Amer 62 (*) >90 (mL/min)   GFR calc Af Amer 72 (*) >90 (mL/min)  ETHANOL      Component Value Range   Alcohol, Ethyl (B) <11  0 - 11 (mg/dL)  URINE RAPID DRUG SCREEN (HOSP PERFORMED)      Component Value Range   Opiates NONE DETECTED  NONE DETECTED    Cocaine NONE DETECTED  NONE DETECTED    Benzodiazepines POSITIVE (*) NONE DETECTED    Amphetamines NONE DETECTED  NONE DETECTED    Tetrahydrocannabinol POSITIVE (*) NONE DETECTED    Barbiturates NONE DETECTED  NONE DETECTED   ACETAMINOPHEN LEVEL      Component Value Range   Acetaminophen (Tylenol), Serum <15.0  10 - 30 (ug/mL)  SALICYLATE LEVEL      Component Value Range   Salicylate Lvl <2.0 (*) 2.8 - 20.0 (mg/dL)  ACETAMINOPHEN LEVEL      Component Value Range   Acetaminophen (Tylenol), Serum <15.0  10 - 30 (ug/mL)  GLUCOSE, CAPILLARY      Component Value Range   Glucose-Capillary 74  70 - 99 (mg/dL)     MDM    I personally performed the services described in this documentation, which was scribed in my presence. The recorded information has been reviewed and considered.          Joya Gaskins, MD 10/04/11 2217

## 2011-10-04 NOTE — ED Notes (Signed)
Upon entering room, patient is awake and oriented x 4. Stated he is going to call his daughter to pick him up. Gave patient phone to call daughter.

## 2011-10-04 NOTE — Progress Notes (Signed)
Assessment Note   Craig Orr is an 45 y.o. male.   Axis I: Major Depression, Rec Axis II: Deferred Axis III: History reviewed. No pertinent past medical history. Axis IV: problems with access to health care services Axis V: 61-70 mild symptoms  Past Medical History: History reviewed. No pertinent past medical history.  Past Surgical History  Procedure Date  . Arm surgery   . Neck surgery     Family History: History reviewed. No pertinent family history.  Social History:  reports that he has never smoked. He does not have any smokeless tobacco history on file. He reports that he drinks alcohol. He reports that he does not use illicit drugs.  Allergies: No Known Allergies  Home Medications:  Medications Prior to Admission  Medication Dose Route Frequency Provider Last Rate Last Dose  . naloxone (NARCAN) injection 0.4 mg  0.4 mg Intravenous Once Joya Gaskins, MD   0.4 mg at 10/04/11 1654  . sodium chloride 0.9 % bolus 500 mL  500 mL Intravenous Once Joya Gaskins, MD   1,000 mL at 10/04/11 1648   Medications Prior to Admission  Medication Sig Dispense Refill  . HYDROcodone-acetaminophen (NORCO) 7.5-325 MG per tablet 1 po q4h prn pain  20 tablet  0    OB/GYN Status:  No LMP for male patient.  General Assessment Data Assessment Number: 1  Living Arrangements: Alone Can pt return to current living arrangement?: Yes Is patient capable of signing voluntary admission?: Yes Transfer from: Home Referral Source:  [facility] er [id number])  Risk to self Suicidal Ideation: No Suicidal Intent: No Is patient at risk for suicide?: No Suicidal Plan?: No Access to Means: No What has been your use of drugs/alcohol within the last 12 months?: MARIJUANA Intentional Self Injurious Behavior: None Factors that decrease suicide risk: Religious beliefs Family Suicide History: No Recent stressful life event(s): Other (Comment) (MEDICAL PXS) Persecutory voices/beliefs?:  No Depression: Yes Depression Symptoms: Loss of interest in usual pleasures;Feeling worthless/self pity Substance abuse history and/or treatment for substance abuse?: Yes Suicide prevention information given to non-admitted patients:  (MARIJUANA)  Risk to Others Homicidal Ideation: No Thoughts of Harm to Others: No Current Homicidal Intent: No Current Homicidal Plan: No Access to Homicidal Means: No Identified Victim: NA History of harm to others?: No Assessment of Violence: None Noted Violent Behavior Description: NA Does patient have access to weapons?: No Criminal Charges Pending?: No Does patient have a court date: No     Cognitive Functioning Weight Loss: 1                Values / Beliefs Cultural Requests During Hospitalization: None Spiritual Requests During Hospitalization: None              Disposition:     On Site Evaluation by:   Reviewed with Physician:   PT REPORTED VIA PHONE CALL TO [facility] THAT HE COULD NOT MAKE HIS APPT BECAUSE HE DID NOT FEEL WELL.  HE THINKS HE MAY HAVE OVERDOSED ON HIS MEDICATIONS AND 911 WAS CALLED. PT DENIES S/I H/I AND IS NOT PSYCHOTIC. HE REPORTS TAKING HIS MORNING MEDS AROUND 6AM AND AT 1:30 HE TOOK HIS NOON MEDS.   HE REPORTS HE MAY HAVE FORGOTTEN HE TOOK THEM AND TOOK THEM TWICE. HE JUST KNEW HE WAS GROGGY AND CALLED [facility] TO CANCEL HIS APPT. PT CONTRACTS FOR SAFETY AND IS TO FOLLOW UP WITH [facility]. DISCUSSED WITH DR Bebe Shaggy WHO AGREES WITH DISPOSITION  Craig Orr 10/04/2011 10:18  PM

## 2011-11-15 ENCOUNTER — Ambulatory Visit (HOSPITAL_COMMUNITY)
Admission: RE | Admit: 2011-11-15 | Discharge: 2011-11-15 | Disposition: A | Discharge status: Home / Self Care | Payer: Medicaid Other | Source: Ambulatory Visit | Attending: Orthopedic Surgery | Admitting: Orthopedic Surgery

## 2011-11-15 DIAGNOSIS — M25519 Pain in unspecified shoulder: Secondary | ICD-10-CM | POA: Insufficient documentation

## 2011-11-15 DIAGNOSIS — M7541 Impingement syndrome of right shoulder: Secondary | ICD-10-CM | POA: Insufficient documentation

## 2011-11-15 DIAGNOSIS — M6281 Muscle weakness (generalized): Secondary | ICD-10-CM | POA: Insufficient documentation

## 2011-11-15 DIAGNOSIS — IMO0001 Reserved for inherently not codable concepts without codable children: Secondary | ICD-10-CM | POA: Insufficient documentation

## 2011-11-15 NOTE — Progress Notes (Signed)
Occupational Therapy Evaluation         PLEASE SEE SCANNED MEDICAID EVAL FOR FULL DETAILS Patient Details  Name: Craig Orr MRN: [id number] Date of Birth: 04-Feb-1966  Today's Date: 11/15/2011 Time: 4098-1191 Time Calculation (min): 30 min OT EVAL  Visit#: 1  of 8   Re-eval: 12/07/11  Medicaid requested 3 visits 11/15/11-11/28/11,begin counting visits at next treatment session. Assessment Diagnosis: Right Shoulder Impingement Next MD Visit: 6 weeks Prior Therapy: in the past  Past Medical History: No past medical history on file. Past Surgical History:  Past Surgical History  Procedure Date  . Arm surgery   . Neck surgery     Subjective Symptoms/Limitations Symptoms: S:  I want my arm mobile enough to use without pain. Limitations: History:  [name] has had chronic pain and issues with his right shoulder.  He consulted with Dr. Dion Saucier and has been referred to occupational therapy for evaluation and treatment.  He has had an xray that detected bone spurs and has had MRI. Pain Assessment Currently in Pain?: Yes Pain Score:   7 Pain Location: Shoulder Pain Orientation: Right Pain Type: Acute pain   Assessment RUE AROM (degrees) Overall AROM Right Upper Extremity:  (assessed in seated, ER and IR with shoulder abd) Right Shoulder Flexion  0-170: 110 Degrees Right Shoulder ABduction 0-140: 110 Degrees Right Shoulder Internal Rotation  0-70: 75 Degrees Right Shoulder External Rotation  0-90: 85 Degrees RUE Strength Right Shoulder Flexion:  (4-/5) Right Shoulder ABduction:  (4-/5) Right Shoulder Internal Rotation:  (4-/5) Right Shoulder External Rotation:  (4-/5)  Exercise/Treatments    Occupational Therapy Assessment and Plan OT Assessment and Plan Clinical Impression Statement: A:  Patient presents with decreased AROM and strength and increased pain and restrictions. Rehab Potential: Good OT Frequency: Min 2X/week OT Duration: 4 weeks OT  Treatment/Interventions: Self-care/ADL training;Therapeutic exercise;Manual therapy;Therapeutic activities;Other (comment) (modalities as needed, HEP:  shoulder stretches) OT Plan: P:  Skilled OT intervention to decrease pain and restrictions and increase AROM and strength.  Tx Plan:  MFR and manual stretching to right shoulder region.  Ther Ex:  PROM and dowel in supine.  seated elev, ext, ret, row.  low wall wash, thumbtacks, prot/ret//elev/dep, ball stretches.   Goals Short Term Goals Time to Complete Short Term Goals: 2 weeks Short Term Goal 1: Patient will be educated on a HEP. Short Term Goal 2: Patient will increase AROM to [facility] for increased ability to cast his fishing rod. Short Term Goal 3: Patient will increase right shoulder strength to 4/5 for increased ability to weightbear through his cane. Short Term Goal 4: Patient will decrease pain to 5/10 during functional tasks. Short Term Goal 5: Patient will decrease fascial restrictions from mod to min-mod in his right shoulder. Long Term Goals Time to Complete Long Term Goals: 4 weeks Long Term Goal 1: Patient will return to prior level of I with all B/IADLs and leisure activities. Long Term Goal 2: Patient will increase AROM to WNL for increased ability to reach overhead. Long Term Goal 3: Patient will increase right shoulder strength to 4+/5 for increased ability to weightbear on his cane. Long Term Goal 4: Patient will decrease pain to 3/10 while completing daily activities. Long Term Goal 5: Patient will decrease fascial restrictions to minimal in his right shoulder. End of Session Patient Active Problem List  Diagnoses  . Pain in joint, shoulder region  . Impingement syndrome of right shoulder  . Muscle weakness (generalized)   End of Session  Activity Tolerance: Patient tolerated treatment well General Behavior During Session: [facility] for tasks performed Cognition: [facility] for tasks performed  Time Calculation Start Time:  1445 Stop Time: 1515 Time Calculation (min): 30 min  Shirlean Mylar, OTR/L  11/15/2011, 5:22 PM  Physician Documentation Your signature is required to indicate approval of the treatment plan as stated above.  Please sign and either send electronically or make a copy of this report for your files and return this physician signed original.  Please mark one 1.__approve of plan  2. ___approve of plan with the following conditions.   ______________________________                                                          _____________________ Physician Signature                                                                                                             Date

## 2011-11-18 ENCOUNTER — Ambulatory Visit (HOSPITAL_COMMUNITY)
Admission: RE | Admit: 2011-11-18 | Discharge: 2011-11-18 | Disposition: A | Discharge status: Home / Self Care | Payer: Medicaid Other | Source: Ambulatory Visit | Attending: Family Medicine | Admitting: Family Medicine

## 2011-11-18 DIAGNOSIS — M6281 Muscle weakness (generalized): Secondary | ICD-10-CM

## 2011-11-18 DIAGNOSIS — M7541 Impingement syndrome of right shoulder: Secondary | ICD-10-CM

## 2011-11-18 DIAGNOSIS — M25519 Pain in unspecified shoulder: Secondary | ICD-10-CM

## 2011-11-18 NOTE — Progress Notes (Signed)
Occupational Therapy Treatment  Patient Details  Name: Marx Doig MRN: [id number] Date of Birth: 09/14/1966  Today's Date: 11/18/2011 Time: 1191-4782 Time Calculation (min): 47 min Visit#: 2  of 8   Re-eval: 12/07/11 Manual Therapy [phone] 34' Therapeutic Exercise [phone] 12' Medicaid visit 1 of 3 Medicaid authorized 3 visits through 11/28/11    Subjective Symptoms/Limitations Symptoms: S:  It is hurting some today, it feels tense. Pain Assessment Currently in Pain?: Yes Pain Score:   7 Pain Location: Shoulder Pain Orientation: Right   Exercise/Treatments Supine Protraction: PROM;AAROM;10 reps Horizontal ABduction: PROM;AAROM;10 reps External Rotation: PROM;AAROM;10 reps Internal Rotation: PROM;AAROM;10 reps Flexion: PROM;AAROM;10 reps ABduction: PROM;AAROM;10 reps Seated Elevation: AROM;10 reps Extension: AROM;10 reps Retraction: AROM;10 reps Row: AROM;10 reps PTherapy Ball Flexion: 20 reps ABduction: 20 reps  Occupational Therapy Assessment and Plan OT Assessment and Plan Clinical Impression Statement: A:  Added multiple new exercises which patient tolerated well but no change in pain or stiffness after manual or ther-ex. Rehab Potential: Good OT Plan: P:  Add low wall wash, thumbtacks, prot/ret/elev/dep.   Goals Short Term Goals Time to Complete Short Term Goals: 2 weeks Short Term Goal 1: Patient will be educated on a HEP. Short Term Goal 2: Patient will increase AROM to [facility] for increased ability to cast his fishing rod. Short Term Goal 3: Patient will increase right shoulder strength to 4/5 for increased ability to weightbear through his cane. Short Term Goal 4: Patient will decrease pain to 5/10 during functional tasks. Short Term Goal 5: Patient will decrease fascial restrictions from mod to min-mod in his right shoulder. Long Term Goals Time to Complete Long Term Goals: 4 weeks Long Term Goal 1: Patient will return to prior level of I with all  B/IADLs and leisure activities. Long Term Goal 2: Patient will increase AROM to WNL for increased ability to reach overhead. Long Term Goal 3: Patient will increase right shoulder strength to 4+/5 for increased ability to weightbear on his cane. Long Term Goal 4: Patient will decrease pain to 3/10 while completing daily activities. Long Term Goal 5: Patient will decrease fascial restrictions to minimal in his right shoulder. End of Session Patient Active Problem List  Diagnoses  . Pain in joint, shoulder region  . Impingement syndrome of right shoulder  . Muscle weakness (generalized)   End of Session Activity Tolerance: Patient tolerated treatment well General Behavior During Session: [facility] for tasks performed Cognition: [facility] for tasks performed   Ozie Dimaria L. Thelma Viana, COTA/L  11/18/2011, 5:10 PM

## 2011-11-23 ENCOUNTER — Inpatient Hospital Stay (HOSPITAL_COMMUNITY): Admission: RE | Admit: 2011-11-23 | Payer: Medicaid Other | Source: Ambulatory Visit | Admitting: Occupational Therapy

## 2011-11-25 ENCOUNTER — Inpatient Hospital Stay (HOSPITAL_COMMUNITY): Admission: RE | Admit: 2011-11-25 | Payer: Medicaid Other | Source: Ambulatory Visit | Admitting: Occupational Therapy

## 2011-11-30 ENCOUNTER — Ambulatory Visit (HOSPITAL_COMMUNITY)
Admission: RE | Admit: 2011-11-30 | Discharge: 2011-11-30 | Disposition: A | Discharge status: Home / Self Care | Payer: Medicaid Other | Source: Ambulatory Visit | Attending: Orthopedic Surgery | Admitting: Orthopedic Surgery

## 2011-11-30 DIAGNOSIS — IMO0001 Reserved for inherently not codable concepts without codable children: Secondary | ICD-10-CM | POA: Insufficient documentation

## 2011-11-30 DIAGNOSIS — M25519 Pain in unspecified shoulder: Secondary | ICD-10-CM | POA: Insufficient documentation

## 2011-11-30 DIAGNOSIS — M6281 Muscle weakness (generalized): Secondary | ICD-10-CM | POA: Insufficient documentation

## 2011-11-30 DIAGNOSIS — M7541 Impingement syndrome of right shoulder: Secondary | ICD-10-CM

## 2011-11-30 NOTE — Progress Notes (Signed)
Occupational Therapy Treatment  Patient Details  Name: Craig Orr MRN: [id number] Date of Birth: 11/28/1966  Today's Date: 11/30/2011 Time: 2130-8657 Time Calculation (min): 41 min  Visit#: 3  of 8   Re-eval: 12/07/11    Subjective Symptoms/Limitations Symptoms: S:  Its still popping and clicking Pain Assessment Currently in Pain?: Yes Pain Score:   6 Pain Location: Shoulder Pain Orientation: Right Pain Type: Acute pain  O:  Exercise/Treatments Supine Protraction: PROM;AROM;10 reps Horizontal ABduction: PROM;AROM;10 reps External Rotation: PROM;AROM;10 reps Internal Rotation: PROM;AROM;10 reps Flexion: PROM;AROM;10 reps ABduction: PROM;AROM;10 reps Seated Elevation: AROM;12 reps Extension: AROM;12 reps Retraction: AROM;12 reps Row: AROM;12 reps Protraction: AROM;10 reps Horizontal ABduction: AROM;10 reps External Rotation: AROM;10 reps Internal Rotation: AROM;10 reps Flexion: AROM;10 reps Abduction: AROM;10 reps Therapy Ball Flexion: 20 reps ABduction: 20 reps ROM / Strengthening / Isometric Strengthening Wall Wash: low x 1' Thumb Tacks: 1' Prot/Ret//Elev/Dep: 1'      Manual Therapy Manual Therapy: Myofascial release Myofascial Release: MFR and manual stretching to right shoulder to decrease pain and restrictions and increase mobility in right shoulder in pain free range.  [id number]  Occupational Therapy Assessment and Plan OT Assessment and Plan Clinical Impression Statement: A:  Added AROM in supine and seated. OT Plan: P:  Attempt UBE.   Goals Short Term Goals Time to Complete Short Term Goals: 2 weeks Short Term Goal 1: Patient will be educated on a HEP. Short Term Goal 1 Progress: Progressing toward goal Short Term Goal 2: Patient will increase AROM to [facility] for increased ability to cast his fishing rod. Short Term Goal 2 Progress: Progressing toward goal Short Term Goal 3: Patient will increase right shoulder strength to 4/5 for increased  ability to weightbear through his cane. Short Term Goal 3 Progress: Progressing toward goal Short Term Goal 4: Patient will decrease pain to 5/10 during functional tasks. Short Term Goal 4 Progress: Progressing toward goal Short Term Goal 5: Patient will decrease fascial restrictions from mod to min-mod in his right shoulder. Short Term Goal 5 Progress: Progressing toward goal Long Term Goals Time to Complete Long Term Goals: 4 weeks Long Term Goal 1: Patient will return to prior level of I with all B/IADLs and leisure activities. Long Term Goal 1 Progress: Progressing toward goal Long Term Goal 2: Patient will increase AROM to WNL for increased ability to reach overhead. Long Term Goal 2 Progress: Progressing toward goal Long Term Goal 3: Patient will increase right shoulder strength to 4+/5 for increased ability to weightbear on his cane. Long Term Goal 3 Progress: Progressing toward goal Long Term Goal 4: Patient will decrease pain to 3/10 while completing daily activities. Long Term Goal 4 Progress: Progressing toward goal Long Term Goal 5: Patient will decrease fascial restrictions to minimal in his right shoulder. Long Term Goal 5 Progress: Progressing toward goal  Problem List Patient Active Problem List  Diagnoses  . Pain in joint, shoulder region  . Impingement syndrome of right shoulder  . Muscle weakness (generalized)    End of Session Activity Tolerance: Patient tolerated treatment well General Behavior During Session: [facility] for tasks performed Cognition: [facility] for tasks performed   Shirlean Mylar, OTR/L  11/30/2011, 3:13 PM

## 2011-12-02 ENCOUNTER — Inpatient Hospital Stay (HOSPITAL_COMMUNITY): Admission: RE | Admit: 2011-12-02 | Payer: Medicaid Other | Source: Ambulatory Visit | Admitting: Specialist

## 2011-12-06 ENCOUNTER — Ambulatory Visit (HOSPITAL_COMMUNITY): Payer: Medicaid Other | Admitting: Specialist

## 2011-12-08 ENCOUNTER — Ambulatory Visit (HOSPITAL_COMMUNITY): Payer: Medicaid Other | Admitting: Specialist

## 2011-12-09 ENCOUNTER — Ambulatory Visit (HOSPITAL_COMMUNITY)
Admission: RE | Admit: 2011-12-09 | Discharge: 2011-12-09 | Disposition: A | Discharge status: Home / Self Care | Payer: Medicaid Other | Source: Ambulatory Visit | Attending: Family Medicine | Admitting: Family Medicine

## 2011-12-09 ENCOUNTER — Other Ambulatory Visit: Payer: Self-pay | Admitting: Neurosurgery

## 2011-12-09 DIAGNOSIS — M6281 Muscle weakness (generalized): Secondary | ICD-10-CM

## 2011-12-09 DIAGNOSIS — M25519 Pain in unspecified shoulder: Secondary | ICD-10-CM

## 2011-12-09 DIAGNOSIS — M5412 Radiculopathy, cervical region: Secondary | ICD-10-CM

## 2011-12-09 DIAGNOSIS — M7541 Impingement syndrome of right shoulder: Secondary | ICD-10-CM

## 2011-12-09 DIAGNOSIS — M542 Cervicalgia: Secondary | ICD-10-CM

## 2011-12-09 NOTE — Progress Notes (Signed)
Occupational Therapy Treatment  Patient Details  Name: Craig Orr MRN: [id number] Date of Birth: 1966-10-20  Today's Date: 12/09/2011 Time: 1530-1610 Manual Therapy 1530-1540 10' Therapeutic Exercise 1540-1610 30' Time Calculation (min): 40 min  Visit#: 4  of 8   Re-eval:      Subjective Symptoms/Limitations Symptoms: S: "It is hurting with the rain  about 7." Repetition: Increases Symptoms Pain Assessment Currently in Pain?: Yes Pain Score:   7 Pain Location: Shoulder Pain Orientation: Right Pain Type: Acute pain Pain Onset: More than a month ago  Precautions/Restrictions     Exercise/Treatments Supine Protraction: PROM;AROM;10 reps Horizontal ABduction: PROM;AROM;10 reps External Rotation: PROM;AROM;10 reps Internal Rotation: PROM;AROM;10 reps Flexion: PROM;AROM;10 reps ABduction: PROM;AROM;10 reps Seated Elevation: AROM;12 reps Extension: AROM;12 reps Retraction: AROM;12 reps Row: AROM;12 reps Protraction: AROM;10 reps Horizontal ABduction: AROM;10 reps External Rotation: AROM;10 reps Internal Rotation: AROM;10 reps Flexion: AROM;10 reps Abduction: AROM;10 reps PTherapy Ball Flexion: 20 reps ABduction: 20 reps ROM / Strengthening / Isometric Strengthening Thumb Tacks: 1' Prot/Ret//Elev/Dep: 1'         Occupational Therapy Assessment and Plan OT Assessment and Plan Clinical Impression Statement: A: Did well AROM in seated position : progress to isometrics Rehab Potential: Good OT Frequency: Min 2X/week OT Duration: 4 weeks OT Treatment/Interventions: Self-care/ADL training;Therapeutic exercise;Manual therapy;Therapeutic activities OT Plan: P: inclue UBE at next session.   Goals Short Term Goals Short Term Goal 1 Progress: Met Short Term Goal 5 Progress: Met  Problem List Patient Active Problem List  Diagnoses  . Pain in joint, shoulder region  . Impingement syndrome of right shoulder  . Muscle weakness (generalized)    End of  Session Activity Tolerance: Patient tolerated treatment well General Behavior During Session: [facility] for tasks performed Cognition: [facility] for tasks performed   Lisa Roca OTR/L 12/09/2011, 4:14 PM

## 2011-12-15 ENCOUNTER — Other Ambulatory Visit: Payer: Medicaid Other

## 2011-12-20 ENCOUNTER — Ambulatory Visit (HOSPITAL_COMMUNITY): Admission: RE | Admit: 2011-12-20 | Payer: Medicaid Other | Source: Ambulatory Visit

## 2012-01-05 ENCOUNTER — Ambulatory Visit (HOSPITAL_COMMUNITY)
Admission: RE | Admit: 2012-01-05 | Discharge: 2012-01-05 | Disposition: A | Discharge status: Home / Self Care | Payer: Medicaid Other | Source: Ambulatory Visit | Attending: Neurosurgery | Admitting: Neurosurgery

## 2012-01-05 DIAGNOSIS — M542 Cervicalgia: Secondary | ICD-10-CM | POA: Insufficient documentation

## 2012-01-05 DIAGNOSIS — M5412 Radiculopathy, cervical region: Secondary | ICD-10-CM

## 2012-01-05 DIAGNOSIS — Z981 Arthrodesis status: Secondary | ICD-10-CM | POA: Insufficient documentation

## 2012-01-05 DIAGNOSIS — M5020 Other cervical disc displacement, unspecified cervical region: Secondary | ICD-10-CM | POA: Insufficient documentation

## 2012-01-05 DIAGNOSIS — R209 Unspecified disturbances of skin sensation: Secondary | ICD-10-CM | POA: Insufficient documentation

## 2015-10-28 ENCOUNTER — Telehealth: Payer: Self-pay

## 2015-10-28 NOTE — Telephone Encounter (Signed)
Pre Visit Call Completed. 

## 2017-05-24 ENCOUNTER — Ambulatory Visit (HOSPITAL_COMMUNITY)
Admission: RE | Admit: 2017-05-24 | Discharge: 2017-05-24 | Disposition: A | Discharge status: Home / Self Care | Payer: Medicaid Other | Source: Ambulatory Visit | Attending: Family Medicine | Admitting: Family Medicine

## 2017-05-24 ENCOUNTER — Other Ambulatory Visit (HOSPITAL_COMMUNITY): Payer: Self-pay | Admitting: Family Medicine

## 2017-05-24 DIAGNOSIS — M542 Cervicalgia: Secondary | ICD-10-CM | POA: Diagnosis not present

## 2017-05-24 DIAGNOSIS — Z981 Arthrodesis status: Secondary | ICD-10-CM | POA: Insufficient documentation

## 2017-05-24 DIAGNOSIS — R52 Pain, unspecified: Secondary | ICD-10-CM

## 2017-09-07 IMAGING — DX DG CERVICAL SPINE COMPLETE 4+V
5 series · 5 of 5 positions shown · non-contrast
Comparison: MRI 01/05/2012.  Radiographs 03/27/2008.

CLINICAL DATA: Left neck pain for 1 month with recent worsening.
Previous fusion.

EXAM:
CERVICAL SPINE - COMPLETE 4+ VIEW

[c-spine lat]
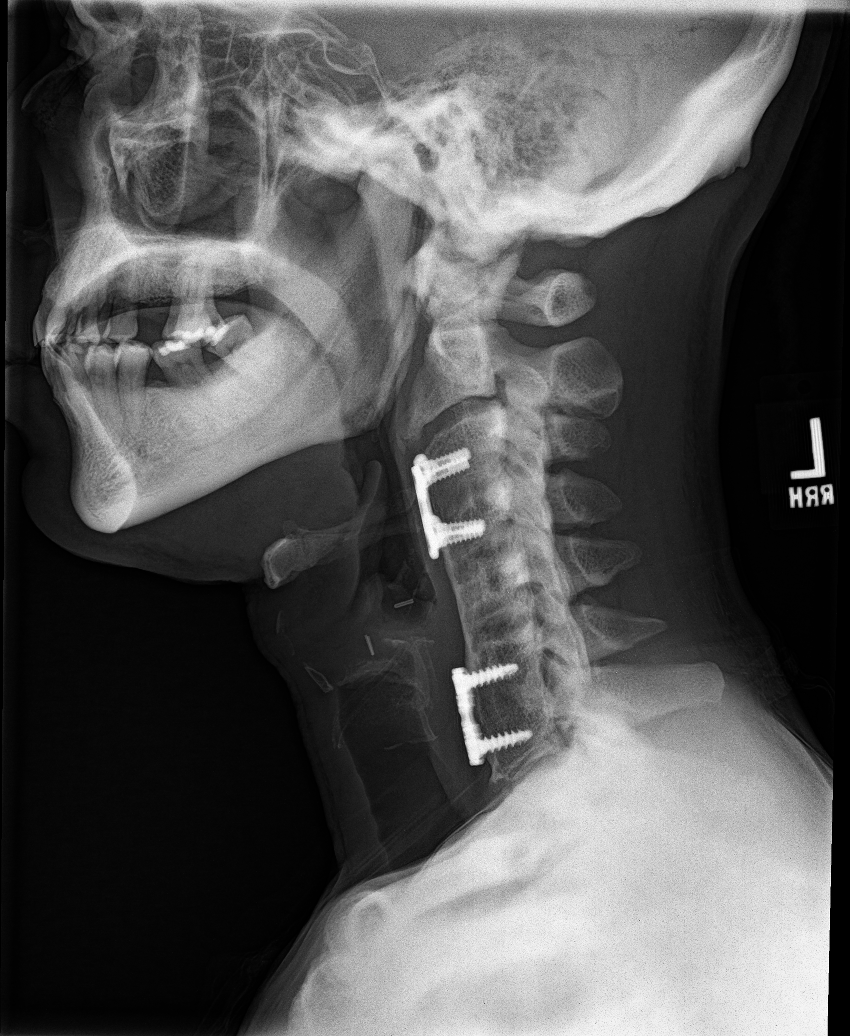

[c-spine obl (1 of 2)]
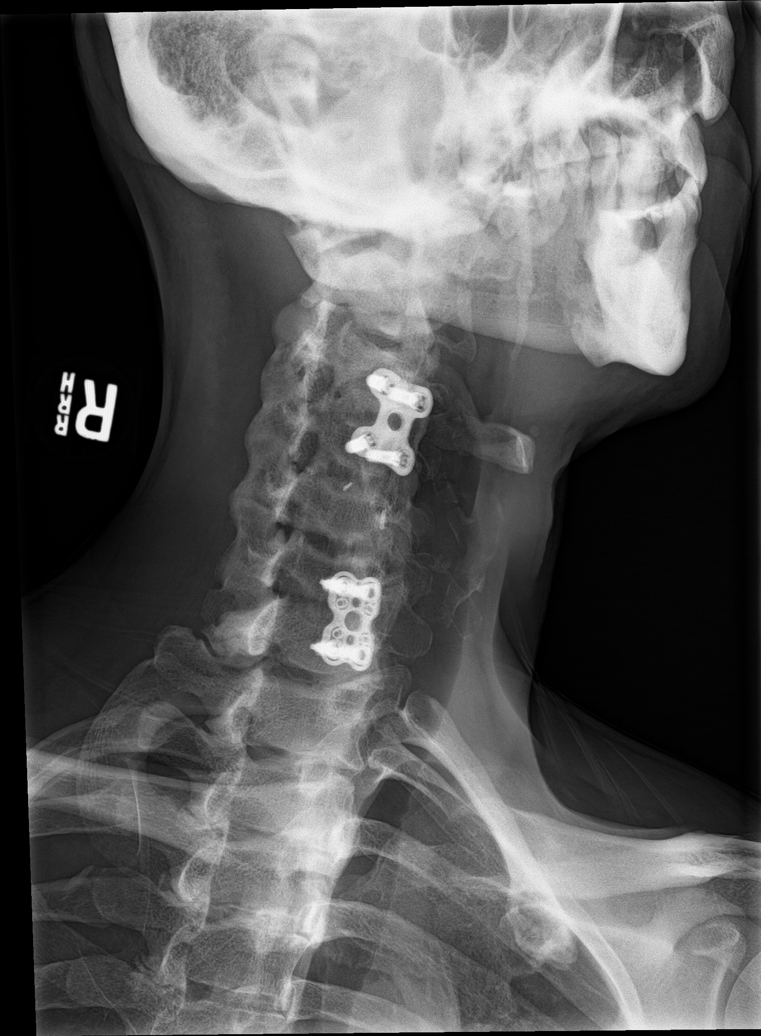

[c-spine obl (2 of 2)]
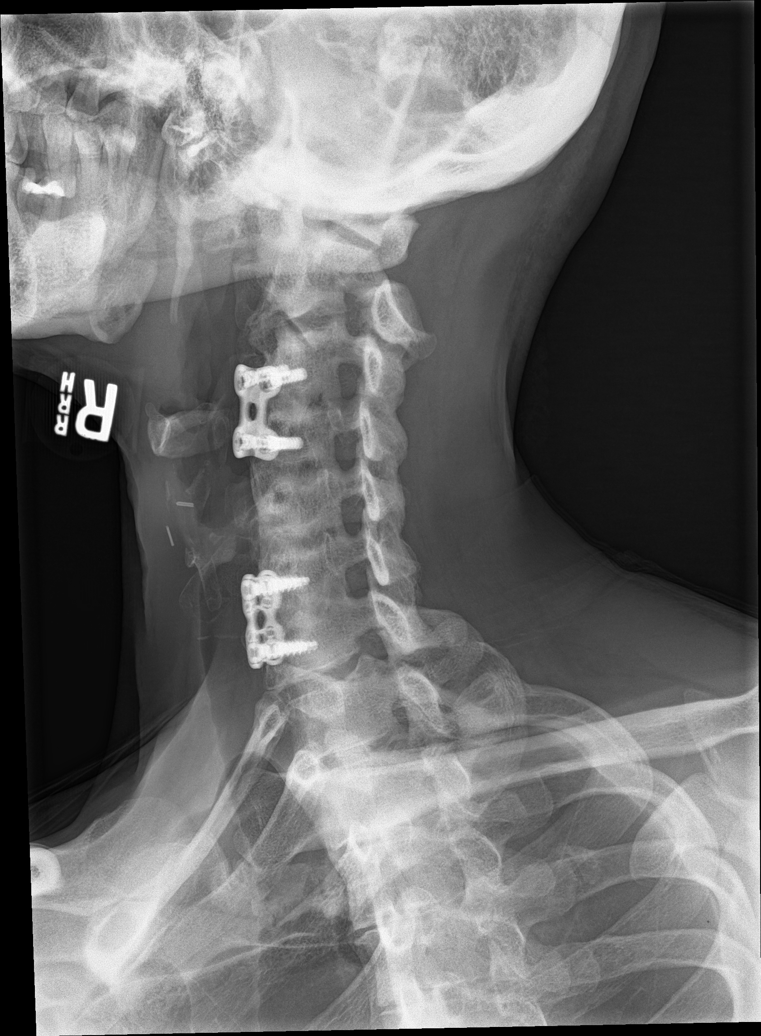

[c-spine ap]
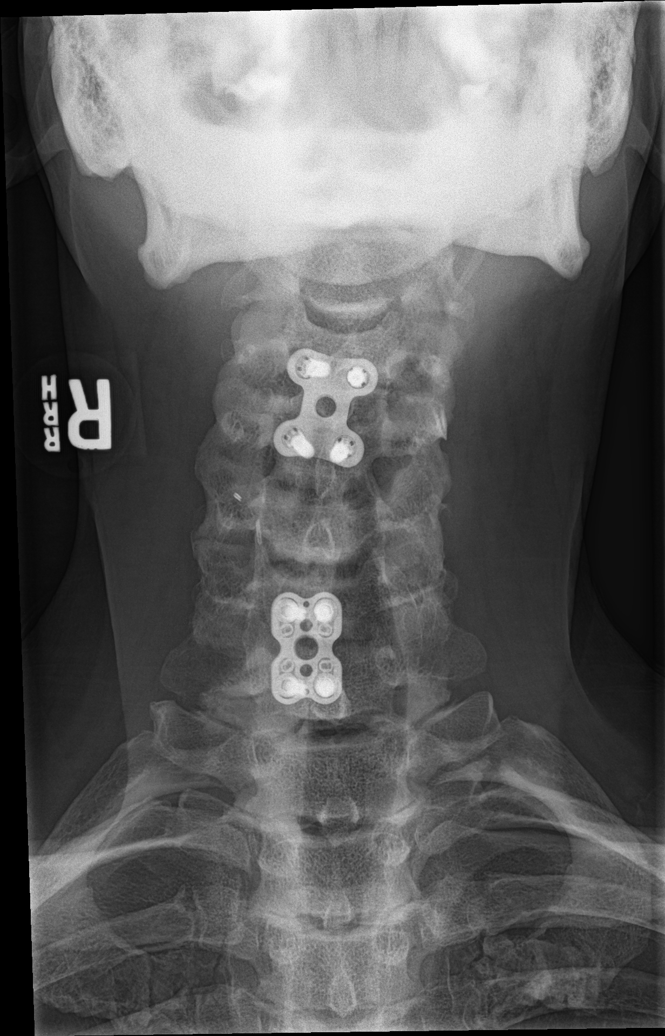

[c-spine open mouth]
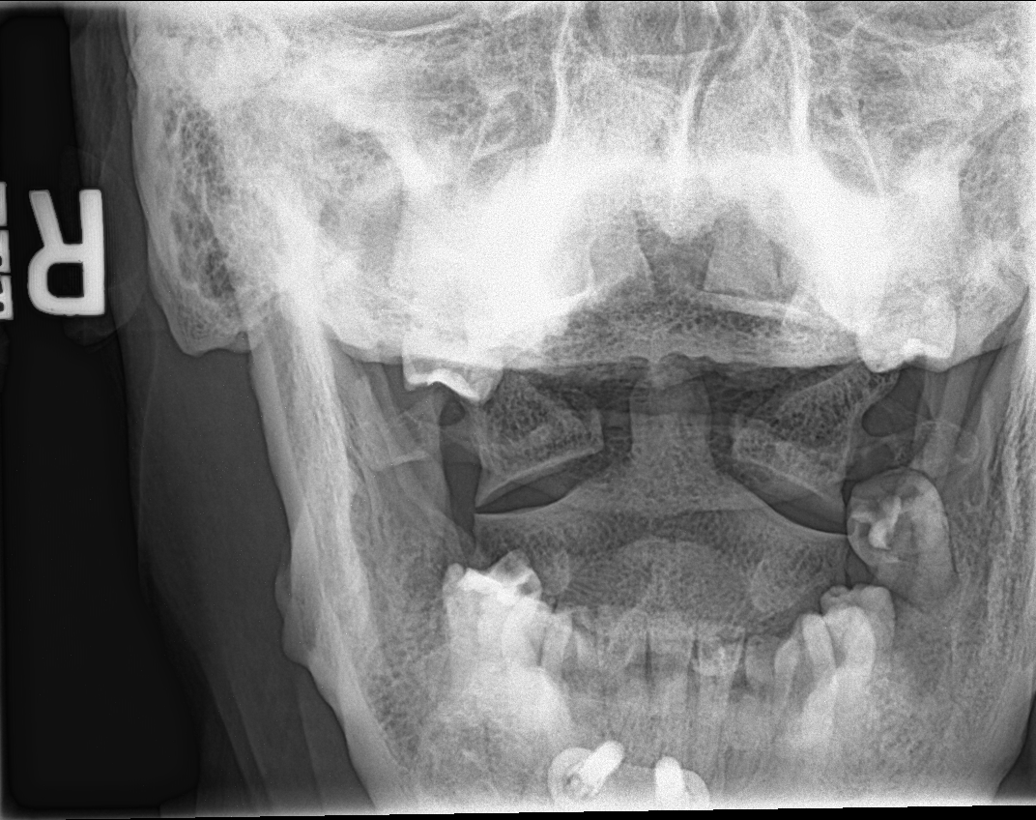

[5 of 5 positions shown; findings below may reference images not displayed]

FINDINGS: The prevertebral soft tissues are normal. Patient is status post
fusion from C3 through C7. Interbody fusion appears solid. There are
anterior plates and screws at the C3-4 and C6-7 levels. There is
mild straightening without evidence of focal angulation, listhesis
or acute fracture. Oblique views demonstrate no high-grade foraminal
narrowing. The C1-2 articulation appears normal in the AP
projection.
IMPRESSION: Stable appearance of the cervical spine status post C3-7 fusion. No
acute findings.
